# Patient Record
Sex: Male | Born: 1955 | Race: Black or African American | Hispanic: No | Marital: Married | Smoking: Never smoker
Health system: Southern US, Community
[De-identification: ages and names within clinical notes are randomized; demographics above are authoritative.]

## PROBLEM LIST (undated history)

## (undated) DIAGNOSIS — F528 Other sexual dysfunction not due to a substance or known physiological condition: Secondary | ICD-10-CM

## (undated) DIAGNOSIS — Z972 Presence of dental prosthetic device (complete) (partial): Secondary | ICD-10-CM

## (undated) DIAGNOSIS — I10 Essential (primary) hypertension: Secondary | ICD-10-CM

## (undated) DIAGNOSIS — Z0000 Encounter for general adult medical examination without abnormal findings: Secondary | ICD-10-CM

## (undated) DIAGNOSIS — K08109 Complete loss of teeth, unspecified cause, unspecified class: Secondary | ICD-10-CM

## (undated) DIAGNOSIS — S0990XA Unspecified injury of head, initial encounter: Secondary | ICD-10-CM

## (undated) DIAGNOSIS — K219 Gastro-esophageal reflux disease without esophagitis: Secondary | ICD-10-CM

## (undated) DIAGNOSIS — E7800 Pure hypercholesterolemia, unspecified: Secondary | ICD-10-CM

## (undated) DIAGNOSIS — E785 Hyperlipidemia, unspecified: Secondary | ICD-10-CM

## (undated) DIAGNOSIS — Z973 Presence of spectacles and contact lenses: Secondary | ICD-10-CM

## (undated) DIAGNOSIS — R7302 Impaired glucose tolerance (oral): Secondary | ICD-10-CM

## (undated) HISTORY — DX: Hyperlipidemia, unspecified: E78.5

## (undated) HISTORY — PX: MULTIPLE TOOTH EXTRACTIONS: SHX2053

## (undated) HISTORY — PX: CIRCUMCISION: SUR203

## (undated) HISTORY — DX: Pure hypercholesterolemia, unspecified: E78.00

## (undated) HISTORY — DX: Essential (primary) hypertension: I10

## (undated) HISTORY — DX: Impaired glucose tolerance (oral): R73.02

## (undated) HISTORY — DX: Unspecified injury of head, initial encounter: S09.90XA

## (undated) HISTORY — DX: Other sexual dysfunction not due to a substance or known physiological condition: F52.8

## (undated) HISTORY — DX: Morbid (severe) obesity due to excess calories: E66.01

## (undated) HISTORY — DX: Encounter for general adult medical examination without abnormal findings: Z00.00

---

## 2004-06-25 ENCOUNTER — Ambulatory Visit: Payer: Self-pay | Admitting: Internal Medicine

## 2006-03-30 ENCOUNTER — Ambulatory Visit: Payer: Self-pay | Admitting: Internal Medicine

## 2006-04-17 ENCOUNTER — Ambulatory Visit: Payer: Self-pay | Admitting: Internal Medicine

## 2006-05-05 ENCOUNTER — Ambulatory Visit: Payer: Self-pay | Admitting: Internal Medicine

## 2007-04-08 ENCOUNTER — Encounter: Payer: Self-pay | Admitting: Internal Medicine

## 2007-04-08 DIAGNOSIS — E7800 Pure hypercholesterolemia, unspecified: Secondary | ICD-10-CM

## 2007-04-08 DIAGNOSIS — F528 Other sexual dysfunction not due to a substance or known physiological condition: Secondary | ICD-10-CM

## 2007-04-08 DIAGNOSIS — S0990XA Unspecified injury of head, initial encounter: Secondary | ICD-10-CM

## 2007-04-08 DIAGNOSIS — I10 Essential (primary) hypertension: Secondary | ICD-10-CM

## 2007-04-08 HISTORY — DX: Essential (primary) hypertension: I10

## 2007-04-08 HISTORY — DX: Pure hypercholesterolemia, unspecified: E78.00

## 2007-04-08 HISTORY — DX: Other sexual dysfunction not due to a substance or known physiological condition: F52.8

## 2007-04-08 HISTORY — DX: Morbid (severe) obesity due to excess calories: E66.01

## 2007-04-08 HISTORY — DX: Unspecified injury of head, initial encounter: S09.90XA

## 2007-04-11 DIAGNOSIS — E785 Hyperlipidemia, unspecified: Secondary | ICD-10-CM

## 2007-04-11 HISTORY — DX: Hyperlipidemia, unspecified: E78.5

## 2007-09-21 ENCOUNTER — Emergency Department (HOSPITAL_COMMUNITY): Admission: EM | Admit: 2007-09-21 | Discharge: 2007-09-21 | Payer: Self-pay | Admitting: Emergency Medicine

## 2007-09-23 ENCOUNTER — Ambulatory Visit: Payer: Self-pay | Admitting: Internal Medicine

## 2007-09-23 LAB — CONVERTED CEMR LAB
AST: 28 units/L (ref 0–37)
Basophils Relative: 0.8 % (ref 0.0–1.0)
Bilirubin, Direct: 0.1 mg/dL (ref 0.0–0.3)
CO2: 28 meq/L (ref 19–32)
Chloride: 106 meq/L (ref 96–112)
Eosinophils Absolute: 0 10*3/uL (ref 0.0–0.6)
Eosinophils Relative: 0.6 % (ref 0.0–5.0)
GFR calc Af Amer: 82 mL/min
GFR calc non Af Amer: 68 mL/min
Glucose, Bld: 97 mg/dL (ref 70–99)
HCT: 43.3 % (ref 39.0–52.0)
Hemoglobin: 14.2 g/dL (ref 13.0–17.0)
Leukocytes, UA: NEGATIVE
Lymphocytes Relative: 29.6 % (ref 12.0–46.0)
MCV: 86.4 fL (ref 78.0–100.0)
Neutro Abs: 4 10*3/uL (ref 1.4–7.7)
Neutrophils Relative %: 57.9 % (ref 43.0–77.0)
Nitrite: NEGATIVE
PSA: 0.6 ng/mL (ref 0.10–4.00)
Sodium: 139 meq/L (ref 135–145)
Specific Gravity, Urine: 1.015 (ref 1.000–1.03)
Total Protein: 7.2 g/dL (ref 6.0–8.3)
Triglycerides: 141 mg/dL (ref 0–149)
Urobilinogen, UA: 0.2 (ref 0.0–1.0)
WBC: 6.9 10*3/uL (ref 4.5–10.5)

## 2008-03-30 ENCOUNTER — Ambulatory Visit: Payer: Self-pay | Admitting: Internal Medicine

## 2008-03-30 ENCOUNTER — Encounter (INDEPENDENT_AMBULATORY_CARE_PROVIDER_SITE_OTHER): Payer: Self-pay | Admitting: *Deleted

## 2008-04-20 ENCOUNTER — Ambulatory Visit: Payer: Self-pay

## 2008-04-20 ENCOUNTER — Encounter: Payer: Self-pay | Admitting: Internal Medicine

## 2008-04-21 ENCOUNTER — Ambulatory Visit: Payer: Self-pay | Admitting: Internal Medicine

## 2010-02-13 ENCOUNTER — Emergency Department (HOSPITAL_COMMUNITY): Admission: EM | Admit: 2010-02-13 | Discharge: 2010-02-13 | Payer: Self-pay | Admitting: Emergency Medicine

## 2010-02-18 ENCOUNTER — Ambulatory Visit: Payer: Self-pay | Admitting: Internal Medicine

## 2010-03-26 ENCOUNTER — Ambulatory Visit: Payer: Self-pay | Admitting: Internal Medicine

## 2010-03-26 LAB — CONVERTED CEMR LAB
AST: 30 units/L (ref 0–37)
Albumin: 3.9 g/dL (ref 3.5–5.2)
Alkaline Phosphatase: 70 units/L (ref 39–117)
Basophils Absolute: 0 10*3/uL (ref 0.0–0.1)
Bilirubin Urine: NEGATIVE
Bilirubin, Direct: 0.1 mg/dL (ref 0.0–0.3)
CO2: 32 meq/L (ref 19–32)
Calcium: 9.1 mg/dL (ref 8.4–10.5)
Creatinine, Ser: 1.1 mg/dL (ref 0.4–1.5)
Eosinophils Absolute: 0.1 10*3/uL (ref 0.0–0.7)
GFR calc non Af Amer: 91.63 mL/min (ref >60)
Glucose, Bld: 84 mg/dL (ref 70–99)
HDL: 42.2 mg/dL (ref >39.00)
Hemoglobin: 13.7 g/dL (ref 13.0–17.0)
Ketones, ur: NEGATIVE mg/dL
LDL Cholesterol: 100 mg/dL — ABNORMAL HIGH (ref 0–99)
Leukocytes, UA: NEGATIVE
Lymphocytes Relative: 24.8 % (ref 12.0–46.0)
MCHC: 33.9 g/dL (ref 30.0–36.0)
Monocytes Relative: 9.7 % (ref 3.0–12.0)
Neutro Abs: 4.9 10*3/uL (ref 1.4–7.7)
Neutrophils Relative %: 64.3 % (ref 43.0–77.0)
PSA: 0.37 ng/mL (ref 0.10–4.00)
Platelets: 223 10*3/uL (ref 150.0–400.0)
RDW: 14.5 % (ref 11.5–14.6)
Sodium: 140 meq/L (ref 135–145)
Specific Gravity, Urine: 1.02 (ref 1.000–1.030)
TSH: 0.5 microintl units/mL (ref 0.35–5.50)
Total Bilirubin: 0.6 mg/dL (ref 0.3–1.2)
Total CHOL/HDL Ratio: 4
Urine Glucose: NEGATIVE mg/dL
Urobilinogen, UA: 0.2 (ref 0.0–1.0)
VLDL: 38.8 mg/dL (ref 0.0–40.0)

## 2010-09-10 NOTE — Assessment & Plan Note (Signed)
Summary: 1 mth physical--stc   Vital Signs:  Patient profile:   55 year old male Height:      74 inches Weight:      269.38 pounds BMI:     34.71 O2 Sat:      96 % on Room air Temp:     97 degrees F oral Pulse rate:   66 / minute BP sitting:   160 / 94  (left arm) Cuff size:   large  Vitals Entered By: Zella Ball Ewing CMA Duncan Dull) (March 26, 2010 10:43 AM)  O2 Flow:  Room air  Preventive Care Screening     declines colonoscopy  CC: Adult Physical/RE   CC:  Adult Physical/RE.  History of Present Illness: overall doing well,Pt denies CP, sob, doe, wheezing, orthopnea, pnd, worsening LE edema, palps, dizziness or syncope  Pt denies new neuro symptoms such as headache, facial or extremity weakness  No fever, wt loss, night sweats, loss of appetite or other constitutional symptoms  Overall good complaicne iwth meds, good tolerance.  BP at work still in the 150's systolic.   [place] wt has been as much as 307 in the past; now improved with better diet per pt  Preventive Screening-Counseling & Management      Drug Use:  no.    Problems Prior to Update: 1)  Preventive Health Care  (ICD-V70.0) 2)  Head Trauma, Closed  (ICD-959.01) 3)  Erectile Dysfunction  (ICD-302.72) 4)  Morbid Obesity  (ICD-278.01) 5)  Hyperlipidemia  (ICD-272.4) 6)  Hypertension  (ICD-401.9) 7)  Hypercholesterolemia  (ICD-272.0)  Medications Prior to Update: 1)  Lisinopril-Hydrochlorothiazide 20-12.5 Mg Tabs (Lisinopril-Hydrochlorothiazide) .... 2 By Mouth Once Daily 2)  Labetalol Hcl 200 Mg Tabs (Labetalol Hcl) .Marland Kitchen.. 1po Two Times A Day 3)  Lovastatin 40 Mg Tabs (Lovastatin) .... Take 1 Tablet By Mouth Once A Day 4)  Adult Aspirin Ec Low Strength 81 Mg Tbec (Aspirin) .Marland Kitchen.. 1 By Mouth Once Daily 5)  Cialis 20 Mg Tabs (Tadalafil) .Marland Kitchen.. 1 By Mouth Every Other Day As Needed  Current Medications (verified): 1)  Lisinopril-Hydrochlorothiazide 20-12.5 Mg Tabs (Lisinopril-Hydrochlorothiazide) .... 2 By Mouth Once  Daily 2)  Labetalol Hcl 200 Mg Tabs (Labetalol Hcl) .Marland Kitchen.. 1po Two Times A Day 3)  Lovastatin 40 Mg Tabs (Lovastatin) .... Take 1 Tablet By Mouth Once A Day 4)  Adult Aspirin Ec Low Strength 81 Mg Tbec (Aspirin) .Marland Kitchen.. 1 By Mouth Once Daily 5)  Cialis 20 Mg Tabs (Tadalafil) .Marland Kitchen.. 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         all otherwise negative per pt -    Physical Exam  General:  alert and overweight-appearing.but less so Head:  normocephalic and atraumatic.   Eyes:  vision grossly intact, pupils equal, and pupils round.   Ears:  R ear normal and L ear normal.   Nose:  no external deformity and no nasal discharge.   Mouth:  no gingival abnormalities and  pharynx pink and moist.   Neck:  supple and no masses.   Lungs:  normal respiratory effort and normal breath sounds.   Heart:  normal rate and regular rhythm.   Abdomen:  soft, non-tender, and normal bowel sounds.   Msk:  no joint tenderness and no joint swelling.   Extremities:  no edema, no erythema  Neurologic:  cranial nerves II-XII intact and strength normal in all extremities.     Impression & Recommendations:  Problem # 1:  Preventive Health Care (ICD-V70.0)  Overall doing well, age appropriate education and counseling updated and referral for appropriate preventive services done unless declined, immunizations up to date or declined, diet counseling done if overweight, urged to quit smoking if smokes , most recent labs reviewed and current ordered if appropriate, ecg reviewed or declined (interpretation per ECG scanned in the EMR if done); information regarding Medicare Prevention requirements given if appropriate; speciality referrals updated as appropriate   Orders: TLB-BMP (Basic Metabolic Panel-BMET) (80048-METABOL) TLB-CBC Platelet - w/Differential (85025-CBCD) TLB-Hepatic/Liver Function Pnl (80076-HEPATIC) TLB-Lipid Panel (80061-LIPID) TLB-PSA (Prostate Specific Antigen) (84153-PSA) TLB-TSH (Thyroid Stimulating Hormone) (84443-TSH) TLB-Udip ONLY (81003-UDIP)  Problem # 2:  HYPERTENSION (ICD-401.9)  His updated medication list for this problem includes:    Lisinopril-hydrochlorothiazide 20-12.5 Mg Tabs (Lisinopril-hydrochlorothiazide) .Marland Kitchen... 2 by mouth once daily    Labetalol Hcl 200 Mg Tabs (Labetalol hcl) .Marland Kitchen... 1po two times a day    Amlodipine Besylate 5 Mg Tabs (Amlodipine besylate) .Marland Kitchen... 1po once daily uncontrolled - to add the norvasc as above, f/u BP at work and next visit  BP today: 160/94 Prior BP: 174/100 (02/18/2010)  Labs Reviewed: K+: 4.5 (09/23/2007) Creat: : 1.2 (09/23/2007)   Chol: 226 (09/23/2007)   HDL: 40.5 (09/23/2007)   LDL: DEL (09/23/2007)   TG:  141 (09/23/2007)  Complete Medication List: 1)  Lisinopril-hydrochlorothiazide 20-12.5 Mg Tabs (Lisinopril-hydrochlorothiazide) .... 2 by mouth once daily 2)  Labetalol Hcl 200 Mg Tabs (Labetalol hcl) .Marland Kitchen.. 1po two times a day 3)  Lovastatin 40 Mg Tabs (Lovastatin) .... Take 1 tablet by mouth once a day 4)  Adult Aspirin Ec Low Strength 81 Mg Tbec (Aspirin) .Marland Kitchen.. 1 by mouth once daily 5)  Cialis 20 Mg Tabs (Tadalafil) .Marland Kitchen.. 1 by mouth every other day as needed 6)  Amlodipine Besylate 5 Mg Tabs (Amlodipine besylate) .Marland Kitchen.. 1po once daily  Other Orders: Tdap => 53yrs IM ([address]) Admin 1st Vaccine ([address])  Patient Instructions: 1)  you had the tetanus shot today 2)  Please go to the Lab in the basement for your blood and/or urine tests today 3)  you are given the refills today - all called in to the pharmacy 4)  start the amlodipine 5 mg per day 5)  please call when you are ready to get the colonoscopy done 6)  Please schedule a follow-up appointment in 6 months 7)  Check your Blood Pressure regularly. If it is above 140/90: you should make an appointment sooner Prescriptions: AMLODIPINE BESYLATE 5 MG TABS (AMLODIPINE BESYLATE) 1po once  daily  #90 x 3   Entered and Authorized by:   Corwin Levins MD   Signed by:   Corwin Levins MD on 03/26/2010   Method used:   Electronically to        [facility] (retail)       [address]       [place], [place]  [address]       Ph: [phone]       Fax: [phone]   RxID:   [id number] LOVASTATIN 40 MG TABS (LOVASTATIN) Take 1 tablet by mouth once a day  #90 x 3   Entered and Authorized by:   Corwin Levins MD   Signed by:   Corwin Levins MD on 03/26/2010   Method used:   Electronically to        [facility] (retail)       [address]       [place], [place]  [address]       Ph: [phone]       Fax: [phone]   RxID:   [id number] LABETALOL HCL 200 MG TABS (LABETALOL HCL)  1po two times a day  #180 x 3   Entered and Authorized by:   Corwin Levins MD   Signed by:   Corwin Levins MD on 03/26/2010   Method used:   Electronically to        [facility] (retail)       [address]       [place], [place]  [address]       Ph: [phone]       Fax: [phone]   RxID:   [id number] LISINOPRIL-HYDROCHLOROTHIAZIDE 20-12.5 MG TABS (LISINOPRIL-HYDROCHLOROTHIAZIDE) 2 by mouth once daily  #180 x 3   Entered and Authorized by:   Corwin Levins MD   Signed by:   Corwin Levins MD on 03/26/2010   Method used:   Electronically to        [facility] (retail)       [address]       [place], [place]  [address]       Ph: [phone]       Fax: [phone]   RxID:   [id number]    Immunizations Administered:  Tetanus Vaccine:    Vaccine Type: Tdap    Site: left deltoid    Mfr: GlaxoSmithKline    Dose: 0.5 ml    Route: IM    Given by: Zella Ball Ewing CMA (AAMA)    Exp. Date: 02/08/2012    Lot #: [id number]    VIS given: 06/29/07 version given March 26, 2010.

## 2010-09-10 NOTE — Assessment & Plan Note (Signed)
Summary: POST ER FU/ HIGH BP /NWS   Vital Signs:  Patient profile:   55 year old male Height:      74 inches Weight:      269 pounds BMI:     34.66 O2 Sat:      98 % on Room air Temp:     97.4 degrees F oral Pulse rate:   86 / minute BP sitting:   174 / 100  (left arm) Cuff size:   large  Vitals Entered By: Zella Ball Ewing CMA Duncan Dull) (February 18, 2010 10:53 AM)  O2 Flow:  Room air CC: Post ER, elevated BP/RE   CC:  Post ER and elevated BP/RE.  History of Present Illness: here to f/u;  "work got busy" and lost to f/u since 2009;  ran out of meds;  was at ERT training and found elev BP there;  went to ER july 7 - started on lis hct 20/25 - 1 per day.  Lost 6 lbs he thinks with more gym work in the past wk.  Pt denies CP, sob, doe, wheezing, orthopnea, pnd, worsening LE edema, palps, dizziness or syncope  Pt denies new neuro symptoms such as headache, facial or extremity weakness Admits to med compliance in 2009 which likely accounted for some element of hard to control BP>. Denies polydipsia, polyuria. Also c/o increased ED symtpoms over the past 6 mo with inabiltiy to maintain erections  Problems Prior to Update: 1)  Preventive Health Care  (ICD-V70.0) 2)  Head Trauma, Closed  (ICD-959.01) 3)  Erectile Dysfunction  (ICD-302.72) 4)  Morbid Obesity  (ICD-278.01) 5)  Hyperlipidemia  (ICD-272.4) 6)  Hypertension  (ICD-401.9) 7)  Hypercholesterolemia  (ICD-272.0)  Medications Prior to Update: 1)  Azor 10-40 Mg  Tabs (Amlodipine-Olmesartan) .Marland Kitchen.. 1 By Mouth Once Daily 2)  Hydrochlorothiazide 25 Mg Tabs (Hydrochlorothiazide) .... Take 1 Tablet By Mouth Once A Day 3)  Bystolic 20 Mg  Tabs (Nebivolol Hcl) .Marland Kitchen.. 1 By Mouth Once Daily 4)  Lovastatin 40 Mg Tabs (Lovastatin) .... Take 1 Tablet By Mouth Once A Day 5)  Adult Aspirin Ec Low Strength 81 Mg Tbec (Aspirin) .Marland Kitchen.. 1 By Mouth Once Daily  Current Medications (verified): 1)  Lisinopril-Hydrochlorothiazide 20-12.5 Mg Tabs  (Lisinopril-Hydrochlorothiazide) .... 2 By Mouth Once Daily 2)  Labetalol Hcl 200 Mg Tabs (Labetalol Hcl) .Marland Kitchen.. 1po Two Times A Day 3)  Lovastatin 40 Mg Tabs (Lovastatin) .... Take 1 Tablet By Mouth Once A Day 4)  Adult Aspirin Ec Low Strength 81 Mg Tbec (Aspirin) .Marland Kitchen.. 1 By Mouth Once Daily 5)  Cialis 20 Mg Tabs (Tadalafil) .Marland Kitchen.. 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   Head:  normocephalic and atraumatic.   Eyes:  vision grossly intact, pupils equal, and pupils round.   Ears:  R ear normal and L ear normal.   Nose:  no external deformity and  no nasal discharge.   Mouth:  no gingival abnormalities and pharynx pink and moist.   Neck:  supple and no masses.   Lungs:  normal respiratory effort and normal breath sounds.   Heart:  normal rate and regular rhythm.   Abdomen:  soft, non-tender, and normal bowel sounds.   Genitalia:  Testes bilaterally descended without nodularity, tenderness or masses. No scrotal masses or lesions. No penis lesions or urethral discharge. Extremities:  no edema, no erythema  Neurologic:  cranial nerves II-XII intact and strength normal in all extremities.     Impression & Recommendations:  Problem # 1:  HYPERTENSION (ICD-401.9)  The following medications were removed from the  medication list:    Hydrochlorothiazide 25 Mg Tabs (Hydrochlorothiazide) .Marland Kitchen... Take 1 tablet by mouth once a day His updated medication list for this problem includes:    Lisinopril-hydrochlorothiazide 20-12.5 Mg Tabs (Lisinopril-hydrochlorothiazide) .Marland Kitchen... 2 by mouth once daily    Labetalol Hcl 200 Mg Tabs (Labetalol hcl) .Marland Kitchen... 1po two times a day treat as above, f/u any worsening signs or symptoms   BP today: 174/100 Prior BP: 140/92 (04/21/2008)  Labs Reviewed: K+: 4.5 (09/23/2007) Creat: : 1.2 (09/23/2007)   Chol: 226 (09/23/2007)   HDL: 40.5 (09/23/2007)   LDL: DEL (09/23/2007)   TG: 141 (09/23/2007)  Problem # 2:  HYPERLIPIDEMIA (ICD-272.4)  His updated medication list for this problem includes:    Lovastatin 40 Mg Tabs (Lovastatin) .Marland Kitchen... Take 1 tablet by mouth once a day  Labs Reviewed: SGOT: 28 (09/23/2007)   SGPT: 37 (09/23/2007)   HDL:40.5 (09/23/2007)  LDL:DEL (09/23/2007)  Chol:226 (09/23/2007)  Trig:141 (09/23/2007) to re-start meds, f/u next visit  Problem # 3:  ERECTILE DYSFUNCTION (ICD-302.72)  His updated medication list for this problem includes:    Cialis 20 Mg Tabs (Tadalafil) .Marland Kitchen... 1 by mouth every other day as needed  Discussed proper use of medications, as well as side effects.   Complete Medication List: 1)  Lisinopril-hydrochlorothiazide 20-12.5 Mg Tabs (Lisinopril-hydrochlorothiazide) .... 2 by mouth once daily 2)  Labetalol Hcl 200 Mg Tabs (Labetalol hcl) .Marland Kitchen.. 1po two times a day 3)  Lovastatin 40 Mg Tabs (Lovastatin) .... Take 1 tablet by mouth once a day 4)  Adult Aspirin Ec Low Strength 81 Mg Tbec (Aspirin) .Marland Kitchen.. 1 by mouth once daily 5)  Cialis 20 Mg Tabs (Tadalafil) .Marland Kitchen.. 1 by mouth every other day as needed  Patient Instructions: 1)  Please take all new medications as prescribed 2)  Continue all previous medications as before this visit  3)  Please schedule a follow-up appointment in 1 month with CPX labs Prescriptions: CIALIS 20 MG TABS  (TADALAFIL) 1 by mouth every other day as needed  #5 x 11   Entered and Authorized by:   Corwin Levins MD   Signed by:   Corwin Levins MD on 02/18/2010   Method used:   Print then Give to Patient   RxID:   [id number] LOVASTATIN 40 MG TABS (LOVASTATIN) Take 1 tablet by mouth once a day  #90 x 3   Entered and Authorized by:   Corwin Levins MD   Signed by:   Corwin Levins MD on 02/18/2010   Method used:   Electronically to        [facility] (retail)       [address]       [place], [place]  [address]  Ph: [phone]       Fax: [phone]   RxID:   [id number] LABETALOL HCL 200 MG TABS (LABETALOL HCL) 1po two times a day  #180 x 3   Entered and Authorized by:   Corwin Levins MD   Signed by:   Corwin Levins MD on 02/18/2010   Method used:   Electronically to        [facility] (retail)       [address]       [place], [place]  [address]       Ph: [phone]       Fax: [phone]   RxID:   [id number] LISINOPRIL-HYDROCHLOROTHIAZIDE 20-12.5 MG TABS (LISINOPRIL-HYDROCHLOROTHIAZIDE) 2 by mouth once daily  #180 x 3   Entered and Authorized by:   Corwin Levins MD   Signed by:   Corwin Levins MD on 02/18/2010   Method used:   Electronically to        [facility] (retail)       [address]       [place], [place]  [address]       Ph: [phone]       Fax: [phone]   RxID:   [id number]

## 2010-09-25 ENCOUNTER — Ambulatory Visit (INDEPENDENT_AMBULATORY_CARE_PROVIDER_SITE_OTHER): Payer: 59 | Admitting: Internal Medicine

## 2010-09-25 ENCOUNTER — Encounter: Payer: Self-pay | Admitting: Internal Medicine

## 2010-09-25 DIAGNOSIS — I10 Essential (primary) hypertension: Secondary | ICD-10-CM

## 2010-09-25 DIAGNOSIS — E785 Hyperlipidemia, unspecified: Secondary | ICD-10-CM

## 2010-09-25 DIAGNOSIS — F528 Other sexual dysfunction not due to a substance or known physiological condition: Secondary | ICD-10-CM

## 2010-10-02 NOTE — Assessment & Plan Note (Signed)
Summary: 6 MOS FU #/CD   Vital Signs:  Patient profile:   55 year old male Height:      74 inches Weight:      263.75 pounds BMI:     33.99 O2 Sat:      98 % on Room air Temp:     97.3 degrees F oral Pulse rate:   69 / minute BP sitting:   158 / 100  (left arm) Cuff size:   large  Vitals Entered By: Zella Ball Ewing CMA (AAMA) (September 25, 2010 9:22 AM)  O2 Flow:  Room air CC: 6 month ROV/RE   CC:  6 month ROV/RE.  History of Present Illness: here to f/u ;  Pt denies CP, worsening sob, doe, wheezing, orthopnea, pnd, worsening LE edema, palps, dizziness or syncope  Pt denies new neuro symptoms such as headache, facial or extremity weakness   Pt denies polydipsia, polyuria   Overall good compliance with meds, trying to follow low chol diet,  - lost 6 lbs in 6 months with  better diet;  trying to walk most days for excercise;    BP at home not well controlled - similar to today .  No fever, wt loss, night sweats, loss of appetite or other constitutional symptoms  Overall good compliance with meds, and good tolerability.  Denies worsening depressive symptoms, suicidal ideation, or panic, though has mild anxiety.  Also with ongoing ED symtpoms - needs cialis regularly, no lack of libido, and med works ok.    Problems Prior to Update: 1)  Preventive Health Care  (ICD-V70.0) 2)  Head Trauma, Closed  (ICD-959.01) 3)  Erectile Dysfunction  (ICD-302.72) 4)  Morbid Obesity  (ICD-278.01) 5)  Hyperlipidemia  (ICD-272.4) 6)  Hypertension  (ICD-401.9) 7)  Hypercholesterolemia  (ICD-272.0)  Medications Prior to Update: 1)  Lisinopril-Hydrochlorothiazide 20-12.5 Mg Tabs (Lisinopril-Hydrochlorothiazide) .... 2 By Mouth Once Daily 2)  Labetalol Hcl 200 Mg Tabs (Labetalol Hcl) .Marland Kitchen.. 1po Two Times A Day 3)  Lovastatin 40 Mg Tabs (Lovastatin) .... Take 1 Tablet By Mouth Once A Day 4)  Adult Aspirin Ec Low Strength 81 Mg Tbec (Aspirin) .Marland Kitchen.. 1 By Mouth Once Daily 5)  Cialis 20 Mg Tabs (Tadalafil) .Marland Kitchen.. 1  By Mouth Every Other Day As Needed 6)  Amlodipine Besylate 5 Mg Tabs (Amlodipine Besylate) .Marland Kitchen.. 1po Once Daily  Current Medications (verified): 1)  Lisinopril-Hydrochlorothiazide 20-12.5 Mg Tabs (Lisinopril-Hydrochlorothiazide) .... 2 By Mouth Once Daily 2)  Labetalol Hcl 300 Mg Tabs (Labetalol Hcl) .Marland Kitchen.. 1po Two Times A Day 3)  Lovastatin 40 Mg Tabs (Lovastatin) .... Take 1 Tablet By Mouth Once A Day 4)  Adult Aspirin Ec Low Strength 81 Mg Tbec (Aspirin) .Marland Kitchen.. 1 By Mouth Once Daily 5)  Cialis 20 Mg Tabs (Tadalafil) .Marland Kitchen.. 1 By Mouth Every Other Day As Needed 6)  Amlodipine Besylate 10 Mg Tabs (Amlodipine Besylate) .Marland Kitchen.. 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   Eyes:  vision grossly intact, pupils equal, and pupils round.   Ears:  R ear normal and L ear normal.   Nose:  no external deformity and no nasal discharge.   Mouth:  no gingival abnormalities and pharynx pink and moist.   Neck:  supple and no masses.   Lungs:  normal respiratory effort and normal breath sounds.   Heart:  normal rate and regular rhythm.   Extremities:  no edema, no erythema    Impression & Recommendations:  Problem # 1:  HYPERTENSION (ICD-401.9)  His updated medication list for this problem includes:    Lisinopril-hydrochlorothiazide 20-12.5 Mg Tabs (Lisinopril-hydrochlorothiazide) .Marland Kitchen... 2  by mouth once daily    Labetalol Hcl 300 Mg Tabs (Labetalol hcl) .Marland Kitchen... 1po two times a day    Amlodipine Besylate 10 Mg Tabs (Amlodipine besylate) .Marland Kitchen... 1po once daily  BP today: 158/100 Prior BP: 160/94 (03/26/2010)  Labs Reviewed: K+: 4.2 (03/26/2010) Creat: : 1.1 (03/26/2010)   Chol: 181 (03/26/2010)   HDL: 42.20 (03/26/2010)   LDL: 100 (03/26/2010)   TG: 194.0 (03/26/2010) uncontrolled - to increase meds as above, f/u with BP at home and next visit  Problem # 2:  HYPERLIPIDEMIA (ICD-272.4)  His updated medication list for this problem includes:    Lovastatin 40 Mg Tabs (Lovastatin) .Marland Kitchen... Take 1 tablet by mouth once a day  Labs Reviewed: SGOT: 30 (03/26/2010)   SGPT: 24 (03/26/2010)   HDL:42.20 (03/26/2010), 40.5 (09/23/2007)  LDL:100 (03/26/2010), DEL (09/23/2007)  Chol:181 (03/26/2010), 226 (09/23/2007)  Trig:194.0 (03/26/2010), 141 (09/23/2007)  stable overall by hx and exam, ok to continue meds/tx as is , declines further labs at this time, Pt to continue diet efforts, good med tolerance; to check labs next visit - goal LDL less than100   Problem # 3:  ERECTILE DYSFUNCTION (ICD-302.72)  His updated medication list for this problem includes:    Cialis 20 Mg Tabs (Tadalafil) .Marland Kitchen... 1 by mouth every other day as needed d/w pt - declines testosterone check, likely organic - treat as above, f/u any worsening signs or symptoms , declines trial change to less expensive levitra as needed   Complete Medication List: 1)  Lisinopril-hydrochlorothiazide 20-12.5 Mg Tabs (Lisinopril-hydrochlorothiazide) .... 2 by mouth once daily 2)  Labetalol Hcl 300 Mg Tabs (Labetalol hcl) .Marland Kitchen.. 1po two times a day 3)  Lovastatin 40 Mg Tabs (Lovastatin) .... Take 1 tablet by mouth once a day 4)  Adult Aspirin Ec Low Strength 81 Mg Tbec (Aspirin) .Marland Kitchen.. 1 by mouth once daily 5)  Cialis 20 Mg Tabs (Tadalafil) .Marland Kitchen.. 1 by mouth every other day as needed 6)  Amlodipine Besylate 10 Mg Tabs (Amlodipine besylate)  .Marland Kitchen.. 1po once daily  Patient Instructions: 1)  increase the amlodipine to 10 mg per day 2)  increase the labetolol to 300 mg two times a day  3)  Continue all previous medications as before this visit  4)  Please schedule a follow-up appointment in 6 months for CPX with labs 5)  Check your Blood Pressure regularly. If it is above 140/90: you should make an appointment sooner Prescriptions: LABETALOL HCL 300 MG TABS (LABETALOL HCL) 1po two times a day  #60 x 11   Entered and Authorized by:   Corwin Levins MD   Signed by:   Corwin Levins MD on 09/25/2010   Method used:   Print then Give to Patient   RxID:   [id number] AMLODIPINE BESYLATE 10 MG TABS (AMLODIPINE BESYLATE)  1po once daily  #90 x 3   Entered and Authorized by:   Corwin Levins MD   Signed by:   Corwin Levins MD on 09/25/2010   Method used:   Print then Give to Patient   RxID:   [id number]    Orders Added: 1)  Est. Patient Level IV [[address]]

## 2010-10-27 LAB — BASIC METABOLIC PANEL
Calcium: 9 mg/dL (ref 8.4–10.5)
Creatinine, Ser: 1.05 mg/dL (ref 0.4–1.5)
GFR calc Af Amer: >60 mL/min (ref >60)
Sodium: 136 mEq/L (ref 135–145)

## 2010-10-27 LAB — URINALYSIS, ROUTINE W REFLEX MICROSCOPIC
Bilirubin Urine: NEGATIVE
Glucose, UA: NEGATIVE mg/dL
Leukocytes, UA: NEGATIVE
Nitrite: NEGATIVE
Specific Gravity, Urine: 1.026 (ref 1.005–1.030)
pH: 5 (ref 5.0–8.0)

## 2010-10-27 LAB — URINE MICROSCOPIC-ADD ON

## 2011-03-21 ENCOUNTER — Other Ambulatory Visit (INDEPENDENT_AMBULATORY_CARE_PROVIDER_SITE_OTHER): Payer: 59 | Admitting: Internal Medicine

## 2011-03-21 ENCOUNTER — Other Ambulatory Visit: Payer: Self-pay | Admitting: Internal Medicine

## 2011-03-21 ENCOUNTER — Other Ambulatory Visit (INDEPENDENT_AMBULATORY_CARE_PROVIDER_SITE_OTHER): Payer: 59

## 2011-03-21 DIAGNOSIS — Z0000 Encounter for general adult medical examination without abnormal findings: Secondary | ICD-10-CM

## 2011-03-21 DIAGNOSIS — Z1289 Encounter for screening for malignant neoplasm of other sites: Secondary | ICD-10-CM

## 2011-03-21 LAB — CBC WITH DIFFERENTIAL/PLATELET
Basophils Absolute: 0 10*3/uL (ref 0.0–0.1)
Eosinophils Absolute: 0 10*3/uL (ref 0.0–0.7)
Lymphocytes Relative: 35.5 % (ref 12.0–46.0)
MCHC: 32.9 g/dL (ref 30.0–36.0)
Neutrophils Relative %: 53.8 % (ref 43.0–77.0)
Platelets: 231 10*3/uL (ref 150.0–400.0)
RDW: 14 % (ref 11.5–14.6)

## 2011-03-21 LAB — BASIC METABOLIC PANEL
Calcium: 9.4 mg/dL (ref 8.4–10.5)
Chloride: 102 mEq/L (ref 96–112)
Creatinine, Ser: 1.3 mg/dL (ref 0.4–1.5)
Sodium: 140 mEq/L (ref 135–145)

## 2011-03-21 LAB — HEPATIC FUNCTION PANEL
AST: 29 U/L (ref 0–37)
Albumin: 4 g/dL (ref 3.5–5.2)
Alkaline Phosphatase: 71 U/L (ref 39–117)
Total Protein: 7.2 g/dL (ref 6.0–8.3)

## 2011-03-21 LAB — URINALYSIS, ROUTINE W REFLEX MICROSCOPIC
Bilirubin Urine: NEGATIVE
Leukocytes, UA: NEGATIVE
Nitrite: NEGATIVE
Specific Gravity, Urine: 1.02 (ref 1.000–1.030)
pH: 6 (ref 5.0–8.0)

## 2011-03-21 LAB — LIPID PANEL
Cholesterol: 190 mg/dL (ref 0–200)
Triglycerides: 110 mg/dL (ref 0.0–149.0)

## 2011-03-21 LAB — PSA: PSA: 0.6 ng/mL (ref 0.10–4.00)

## 2011-03-22 ENCOUNTER — Encounter: Payer: Self-pay | Admitting: Internal Medicine

## 2011-03-22 DIAGNOSIS — Z0000 Encounter for general adult medical examination without abnormal findings: Secondary | ICD-10-CM | POA: Insufficient documentation

## 2011-03-22 DIAGNOSIS — Z0001 Encounter for general adult medical examination with abnormal findings: Secondary | ICD-10-CM | POA: Insufficient documentation

## 2011-03-22 HISTORY — DX: Encounter for general adult medical examination without abnormal findings: Z00.00

## 2011-03-28 ENCOUNTER — Encounter: Payer: Self-pay | Admitting: Internal Medicine

## 2011-03-28 ENCOUNTER — Ambulatory Visit (INDEPENDENT_AMBULATORY_CARE_PROVIDER_SITE_OTHER): Payer: 59 | Admitting: Internal Medicine

## 2011-03-28 VITALS — BP 128/82 | HR 69 | Temp 98.2°F | Ht 74.0 in | Wt 269.4 lb

## 2011-03-28 DIAGNOSIS — I10 Essential (primary) hypertension: Secondary | ICD-10-CM

## 2011-03-28 DIAGNOSIS — E785 Hyperlipidemia, unspecified: Secondary | ICD-10-CM

## 2011-03-28 DIAGNOSIS — Z0000 Encounter for general adult medical examination without abnormal findings: Secondary | ICD-10-CM

## 2011-03-28 MED ORDER — LISINOPRIL-HYDROCHLOROTHIAZIDE 20-12.5 MG PO TABS: 2.0000 | ORAL_TABLET | Freq: Every day | ORAL | Status: DC

## 2011-03-28 MED ORDER — AMLODIPINE BESYLATE 10 MG PO TABS: 10.0000 mg | ORAL_TABLET | Freq: Every day | ORAL | Status: DC

## 2011-03-28 MED ORDER — TADALAFIL 20 MG PO TABS: ORAL_TABLET | ORAL | Status: DC

## 2011-03-28 MED ORDER — LABETALOL HCL 300 MG PO TABS: 300.0000 mg | ORAL_TABLET | Freq: Two times a day (BID) | ORAL | Status: DC

## 2011-03-28 MED ORDER — LOVASTATIN 40 MG PO TABS: 40.0000 mg | ORAL_TABLET | Freq: Every day | ORAL | Status: DC

## 2011-03-28 NOTE — Assessment & Plan Note (Signed)
Stable, ecg reviewed - sinus without acute

## 2011-03-28 NOTE — Patient Instructions (Signed)
Continue all other medications as before All of your prescriptoins were sent to the pharmacy You will be contacted regarding the referral for: colonoscopy Please return in 1 year for your yearly visit, or sooner if needed, with Lab testing done 3-5 days before

## 2011-03-28 NOTE — Progress Notes (Signed)
Subjective:    Patient ID: Peter Kent, male    DOB: Dec 20, 1955, 55 y.o.   MRN: [id number]  HPI Here for wellness and f/u;  Overall doing ok;  Pt denies CP, worsening SOB, DOE, wheezing, orthopnea, PND, worsening LE edema, palpitations, dizziness or syncope.  Pt denies neurological change such as new Headache, facial or extremity weakness.  Pt denies polydipsia, polyuria, or low sugar symptoms. Pt states overall good compliance with treatment and medications, good tolerability, and trying to follow lower cholesterol diet.  Pt denies worsening depressive symptoms, suicidal ideation or panic. No fever, wt loss, night sweats, loss of appetite, or other constitutional symptoms.  Pt states good ability with ADL's, low fall risk, home safety reviewed and adequate, no significant changes in hearing or vision, and occasionally active with exercise. No acute complaints. Past Medical History  Diagnosis Date  . ERECTILE DYSFUNCTION 04/08/2007  . HEAD TRAUMA, CLOSED 04/08/2007  . HYPERCHOLESTEROLEMIA 04/08/2007  . HYPERLIPIDEMIA 04/11/2007  . HYPERTENSION 04/08/2007  . Morbid obesity 04/08/2007  . Preventative health care 03/22/2011   No past surgical history on file.  reports that he has never smoked. He does not have any smokeless tobacco history on file. His alcohol and drug histories not on file. family history includes Cancer in his other; Diabetes in his other; Heart disease in his other; and Hypertension in his other. No Known Allergies Current Outpatient Prescriptions on File Prior to Visit  Medication Sig Dispense Refill  . aspirin 81 MG tablet Take 81 mg by mouth daily.         Review of Systems Review of Systems  Constitutional: Negative for diaphoresis, activity change, appetite change and unexpected weight change.  HENT: Negative for hearing loss, ear pain, facial swelling, mouth sores and neck stiffness.   Eyes: Negative for pain, redness and visual disturbance.  Respiratory: Negative for  shortness of breath and wheezing.   Cardiovascular: Negative for chest pain and palpitations.  Gastrointestinal: Negative for diarrhea, blood in stool, abdominal distention and rectal pain.  Genitourinary: Negative for hematuria, flank pain and decreased urine volume.  Musculoskeletal: Negative for myalgias and joint swelling.  Skin: Negative for color change and wound.  Neurological: Negative for syncope and numbness.  Hematological: Negative for adenopathy.  Psychiatric/Behavioral: Negative for hallucinations, self-injury, decreased concentration and agitation.      Objective:   Physical Exam BP 128/82  Pulse 69  Temp(Src) 98.2 F (36.8 C) (Oral)  Ht 6\' 2"  (1.88 m)  Wt 269 lb 6.4 oz (122.199 kg)  BMI 34.59 kg/m2  SpO2 97% Physical Exam  VS noted Constitutional: Pt is oriented to person, place, and time. Appears well-developed and well-nourished.  HENT:  Head: Normocephalic and atraumatic.  Right Ear: External ear normal.  Left Ear: External ear normal.  Nose: Nose normal.  Mouth/Throat: Oropharynx is clear and moist.  Eyes: Conjunctivae and EOM are normal. Pupils are equal, round, and reactive to light.  Neck: Normal range of motion. Neck supple. No JVD present. No tracheal deviation present.  Cardiovascular: Normal rate, regular rhythm, normal heart sounds and intact distal pulses.   Pulmonary/Chest: Effort normal and breath sounds normal.  Abdominal: Soft. Bowel sounds are normal. There is no tenderness.  Musculoskeletal: Normal range of motion. Exhibits no edema.  Lymphadenopathy:  Has no cervical adenopathy.  Neurological: Pt is alert and oriented to person, place, and time. Pt has normal reflexes. No cranial nerve deficit.  Skin: Skin is warm and dry. No rash noted.  Psychiatric:  Has  normal mood and affect. Behavior is normal.     Assessment & Plan:

## 2011-03-28 NOTE — Assessment & Plan Note (Signed)
Mild increased over last yr due to diet indiscretion - ok to cont meds as is, work on lower chol diet  Lab Results  Component Value Date   LDLCALC 120* 03/21/2011

## 2011-03-28 NOTE — Assessment & Plan Note (Signed)
Overall doing well, age appropriate education and counseling updated, referrals for preventative services and immunizations addressed, dietary and smoking counseling addressed, most recent labs and ECG reviewed.  I have personally reviewed and have noted: 1) the patient's medical and social history 2) The pt's use of alcohol, tobacco, and illicit drugs 3) The patient's current medications and supplements 4) Functional ability including ADL's, fall risk, home safety risk, hearing and visual impairment 5) Diet and physical activities 6) Evidence for depression or mood disorder 7) The patient's height, weight, and BMI have been recorded in the chart I have made referrals, and provided counseling and education based on review of the above For colonsocpy as he is due

## 2011-06-05 ENCOUNTER — Encounter: Payer: Self-pay | Admitting: Gastroenterology

## 2011-11-28 ENCOUNTER — Ambulatory Visit (INDEPENDENT_AMBULATORY_CARE_PROVIDER_SITE_OTHER): Payer: 59 | Admitting: Internal Medicine

## 2011-11-28 ENCOUNTER — Encounter: Payer: Self-pay | Admitting: Internal Medicine

## 2011-11-28 ENCOUNTER — Encounter: Payer: Self-pay | Admitting: *Deleted

## 2011-11-28 VITALS — BP 162/100 | HR 72 | Temp 97.8°F | Ht 72.0 in | Wt 259.8 lb

## 2011-11-28 DIAGNOSIS — I10 Essential (primary) hypertension: Secondary | ICD-10-CM

## 2011-11-28 MED ORDER — LABETALOL HCL 200 MG PO TABS: ORAL_TABLET | ORAL | Status: DC

## 2011-11-28 NOTE — Patient Instructions (Signed)
Please increase the labetolol as prescribed (to total 400 mg twice per day) Continue all other medications as before Please check your blood pressure on a regular basis; your goal is to be less than 140/90 You are given the work note Please have the pharmacy call with any refills you may need.

## 2011-11-29 ENCOUNTER — Encounter: Payer: Self-pay | Admitting: Internal Medicine

## 2011-11-29 NOTE — Progress Notes (Signed)
  Subjective:    Patient ID: Peter Kent, male    DOB: [birth year], 56 y.o.   MRN: [id number]  HPI  Here after found to have elev BP by nurse at work 200/120, has not check BP himself recently at home or elsewhere, last seen here aug 2012, has overall doing well and asymptomatic- Pt denies chest pain, increased sob or doe, wheezing, orthopnea, PND, increased LE swelling, palpitations, dizziness or syncope.  Pt denies new neurological symptoms such as new headache, or facial or extremity weakness or numbness   Pt denies polydipsia, polyuria, Pt states overall good compliance with meds, trying to follow lower cholesterol diet, and wt down 10 lbs since last visit.   Pt denies fever, wt loss, night sweats, loss of appetite, or other constitutional symptoms  Denies worsening depressive symptoms, suicidal ideation, or panic.  Does mention eating out at buffet last night with large salt intake but overall tries to avoid.  No hx of known endorgan damage. Past Medical History  Diagnosis Date  . ERECTILE DYSFUNCTION 04/08/2007  . HEAD TRAUMA, CLOSED 04/08/2007  . HYPERCHOLESTEROLEMIA 04/08/2007  . HYPERLIPIDEMIA 04/11/2007  . HYPERTENSION 04/08/2007  . Morbid obesity 04/08/2007  . Preventative health care 03/22/2011   History reviewed. No pertinent past surgical history.  reports that he has never smoked. He has never used smokeless tobacco. He reports that he does not drink alcohol or use illicit drugs. family history includes Cancer in his other; Diabetes in his other; Heart disease in his other; and Hypertension in his other. No Known Allergies Current Outpatient Prescriptions on File Prior to Visit  Medication Sig Dispense Refill  . amLODipine (NORVASC) 10 MG tablet Take 1 tablet (10 mg total) by mouth daily.  90 tablet  3  . aspirin 81 MG tablet Take 81 mg by mouth daily.        Marland Kitchen lisinopril-hydrochlorothiazide (PRINZIDE,ZESTORETIC) 20-12.5 MG per tablet Take 2 tablets by mouth daily.  90 tablet  3  .  lovastatin (MEVACOR) 40 MG tablet Take 1 tablet (40 mg total) by mouth daily.  90 tablet  3   Review of Systems Review of Systems  Constitutional: Negative for diaphoresis and unexpected weight change.  Eyes: Negative for photophobia and visual disturbance.  Respiratory: Negative for choking and stridor.   Gastrointestinal: Negative for vomiting and blood in stool.  Genitourinary: Negative for hematuria and decreased urine volume.  Musculoskeletal: Negative for gait problem.  Skin: Negative for color change and wound.  Neurological: Negative for tremors and numbness.     Objective:   Physical Exam BP 162/100  Pulse 72  Temp(Src) 97.8 F (36.6 C) (Oral)  Ht 6' (1.829 m)  Wt 259 lb 12.8 oz (117.845 kg)  BMI 35.24 kg/m2  SpO2 98% Physical Exam  VS noted Constitutional: Pt appears well-developed and well-nourished.  HENT: Head: Normocephalic.  Right Ear: External ear normal.  Left Ear: External ear normal.  Eyes: Conjunctivae and EOM are normal. Pupils are equal, round, and reactive to light.  Neck: Normal range of motion. Neck supple.  Cardiovascular: Normal rate and regular rhythm.   Pulmonary/Chest: Effort normal and breath sounds normal.  Abd:  Soft, NT, non-distended, + BS Neurological: Pt is alert. No cranial nerve deficit.  Skin: Skin is warm. No erythema.  Psychiatric: Pt behavior is normal. Thought content normal.     Assessment & Plan:

## 2011-11-29 NOTE — Assessment & Plan Note (Signed)
Mild uncontrolled, to incr the labetolol to 400 bid, Continue all other medications as before, low salt diet  BP Readings from Last 3 Encounters:  11/28/11 162/100  03/28/11 128/82  09/25/10 158/100

## 2011-12-01 DIAGNOSIS — Z0279 Encounter for issue of other medical certificate: Secondary | ICD-10-CM

## 2012-03-30 ENCOUNTER — Encounter: Payer: 59 | Admitting: Internal Medicine

## 2012-03-30 ENCOUNTER — Other Ambulatory Visit: Payer: Self-pay | Admitting: Internal Medicine

## 2012-05-26 ENCOUNTER — Encounter: Payer: 59 | Admitting: Internal Medicine

## 2012-07-23 ENCOUNTER — Encounter: Payer: 59 | Admitting: Internal Medicine

## 2012-07-23 DIAGNOSIS — Z0289 Encounter for other administrative examinations: Secondary | ICD-10-CM

## 2012-09-27 ENCOUNTER — Telehealth: Payer: Self-pay

## 2012-09-27 MED ORDER — LOVASTATIN 40 MG PO TABS: 40.0000 mg | ORAL_TABLET | Freq: Every day | ORAL | Status: DC

## 2012-09-27 NOTE — Telephone Encounter (Signed)
rx filled

## 2013-03-09 ENCOUNTER — Other Ambulatory Visit: Payer: Self-pay | Admitting: Internal Medicine

## 2013-04-06 ENCOUNTER — Other Ambulatory Visit: Payer: Self-pay

## 2013-04-06 MED ORDER — LISINOPRIL-HYDROCHLOROTHIAZIDE 20-12.5 MG PO TABS: ORAL_TABLET | ORAL | Status: DC

## 2013-04-23 ENCOUNTER — Other Ambulatory Visit: Payer: Self-pay | Admitting: Internal Medicine

## 2013-04-25 NOTE — Telephone Encounter (Signed)
Pt has not been seen since 4.19.13. OK to refill?

## 2013-04-26 NOTE — Telephone Encounter (Signed)
Called the patient to inform, but numbers in chart were not in service

## 2013-04-26 NOTE — Telephone Encounter (Signed)
Done erx - 1 mo only  Due for ROV - robin to let pt know

## 2013-05-17 ENCOUNTER — Encounter: Payer: Self-pay | Admitting: Internal Medicine

## 2013-05-17 ENCOUNTER — Ambulatory Visit (INDEPENDENT_AMBULATORY_CARE_PROVIDER_SITE_OTHER): Payer: 59 | Admitting: Internal Medicine

## 2013-05-17 VITALS — BP 136/100 | HR 77 | Temp 97.9°F | Ht 72.0 in | Wt 252.0 lb

## 2013-05-17 DIAGNOSIS — K3189 Other diseases of stomach and duodenum: Secondary | ICD-10-CM

## 2013-05-17 DIAGNOSIS — Z0000 Encounter for general adult medical examination without abnormal findings: Secondary | ICD-10-CM

## 2013-05-17 DIAGNOSIS — R1013 Epigastric pain: Secondary | ICD-10-CM

## 2013-05-17 MED ORDER — LOVASTATIN 40 MG PO TABS: 40.0000 mg | ORAL_TABLET | Freq: Every day | ORAL | Status: DC

## 2013-05-17 MED ORDER — OMEPRAZOLE 20 MG PO CPDR: 20.0000 mg | DELAYED_RELEASE_CAPSULE | Freq: Every day | ORAL | Status: DC

## 2013-05-17 NOTE — Patient Instructions (Signed)
Please take all new medication as prescribed Please continue all other medications as before, and refills have been done if requested. Please have the pharmacy call with any other refills you may need. Please continue your efforts at being more active, low cholesterol diet, and weight control. You are otherwise up to date with prevention measures today. You will be contacted regarding the referral for: colonoscopy Please go to the LAB in the Basement (turn left off the elevator) for the tests to be done tomorrow You will be contacted by phone if any changes need to be made immediately.  Otherwise, you will receive a letter about your results with an explanation, but please check with MyChart first.  Please remember to sign up for My Chart if you have not done so, as this will be important to you in the future with finding out test results, communicating by private email, and scheduling acute appointments online when needed.  Please return in 1 year for your yearly visit, or sooner if needed

## 2013-05-17 NOTE — Assessment & Plan Note (Signed)

## 2013-05-17 NOTE — Progress Notes (Signed)
Subjective:    Patient ID: Peter Kent, male    DOB: 03/21/1956, 57 y.o.   MRN: [id number]  HPI  Here for wellness and f/u;  Overall doing ok;  Pt denies CP, worsening SOB, DOE, wheezing, orthopnea, PND, worsening LE edema, palpitations, dizziness or syncope.  Pt denies neurological change such as new headache, facial or extremity weakness.  Pt denies polydipsia, polyuria, or low sugar symptoms. Pt states overall good compliance with treatment and medications, good tolerability, and has been trying to follow lower cholesterol diet.  Pt denies worsening depressive symptoms, suicidal ideation or panic. No fever, night sweats, wt loss, loss of appetite, or other constitutional symptoms.  Pt states good ability with ADL's, has low fall risk, home safety reviewed and adequate, no other significant changes in hearing or vision, and only occasionally active with exercise.  Also with episode several in last few wks of odd discomfort and nausea, none today, better with TUMS. Denies dysphagia, n/v, bowel change or blood.  Past Medical History  Diagnosis Date  . ERECTILE DYSFUNCTION 04/08/2007  . HEAD TRAUMA, CLOSED 04/08/2007  . HYPERCHOLESTEROLEMIA 04/08/2007  . HYPERLIPIDEMIA 04/11/2007  . HYPERTENSION 04/08/2007  . Morbid obesity 04/08/2007  . Preventative health care 03/22/2011   No past surgical history on file.  reports that he has never smoked. He has never used smokeless tobacco. He reports that he does not drink alcohol or use illicit drugs. family history includes Cancer in his other; Diabetes in his other; Heart disease in his other; Hypertension in his other. No Known Allergies Current Outpatient Prescriptions on File Prior to Visit  Medication Sig Dispense Refill  . amLODipine (NORVASC) 10 MG tablet TAKE ONE TABLET BY MOUTH EVERY DAY  30 tablet  0  . aspirin 81 MG tablet Take 81 mg by mouth daily.        Marland Kitchen labetalol (NORMODYNE) 200 MG tablet 2 tabs by mouth twice per day (for total 400 mg  twice per day)  360 tablet  3  . lisinopril-hydrochlorothiazide (PRINZIDE,ZESTORETIC) 20-12.5 MG per tablet TAKE TWO TABLETS BY MOUTH EVERY DAY  60 tablet  5   No current facility-administered medications on file prior to visit.   Review of Systems Constitutional: Negative for diaphoresis, activity change, appetite change or unexpected weight change.  HENT: Negative for hearing loss, ear pain, facial swelling, mouth sores and neck stiffness.   Eyes: Negative for pain, redness and visual disturbance.  Respiratory: Negative for shortness of breath and wheezing.   Cardiovascular: Negative for chest pain and palpitations.  Gastrointestinal: Negative for diarrhea, blood in stool, abdominal distention or other pain Genitourinary: Negative for hematuria, flank pain or change in urine volume.  Musculoskeletal: Negative for myalgias and joint swelling.  Skin: Negative for color change and wound.  Neurological: Negative for syncope and numbness. other than noted Hematological: Negative for adenopathy.  Psychiatric/Behavioral: Negative for hallucinations, self-injury, decreased concentration and agitation.      Objective:   Physical Exam BP 136/100  Pulse 77  Temp(Src) 97.9 F (36.6 C) (Oral)  Ht 6' (1.829 m)  Wt 252 lb (114.306 kg)  BMI 34.17 kg/m2  SpO2 97% VS noted,  Constitutional: Pt is oriented to person, place, and time. Appears well-developed and well-nourished.  Head: Normocephalic and atraumatic.  Right Ear: External ear normal.  Left Ear: External ear normal.  Nose: Nose normal.  Mouth/Throat: Oropharynx is clear and moist.  Eyes: Conjunctivae and EOM are normal. Pupils are equal, round, and reactive to light.  Neck: Normal range of motion. Neck supple. No JVD present. No tracheal deviation present.  Cardiovascular: Normal rate, regular rhythm, normal heart sounds and intact distal pulses.   Pulmonary/Chest: Effort normal and breath sounds normal.  Abdominal: Soft. Bowel  sounds are normal. There is no tenderness. No HSM  Musculoskeletal: Normal range of motion. Exhibits no edema.  Lymphadenopathy:  Has no cervical adenopathy.  Neurological: Pt is alert and oriented to person, place, and time. Pt has normal reflexes. No cranial nerve deficit.  Skin: Skin is warm and dry. No rash noted.  Psychiatric:  Has  normal mood and affect. Behavior is normal.         Assessment & Plan:

## 2013-05-17 NOTE — Assessment & Plan Note (Signed)
prob reflux, for prilosec prn, anti-reflux precautions, to f/u any worsening symptoms or concerns

## 2013-05-18 ENCOUNTER — Encounter: Payer: Self-pay | Admitting: Internal Medicine

## 2013-05-18 ENCOUNTER — Other Ambulatory Visit (INDEPENDENT_AMBULATORY_CARE_PROVIDER_SITE_OTHER): Payer: 59

## 2013-05-18 ENCOUNTER — Telehealth: Payer: Self-pay

## 2013-05-18 DIAGNOSIS — Z0000 Encounter for general adult medical examination without abnormal findings: Secondary | ICD-10-CM

## 2013-05-18 LAB — CBC WITH DIFFERENTIAL/PLATELET
Basophils Absolute: 0 10*3/uL (ref 0.0–0.1)
Basophils Relative: 0.4 % (ref 0.0–3.0)
Eosinophils Relative: 0.4 % (ref 0.0–5.0)
Lymphocytes Relative: 27.2 % (ref 12.0–46.0)
MCHC: 33.9 g/dL (ref 30.0–36.0)
Monocytes Relative: 8 % (ref 3.0–12.0)
Neutrophils Relative %: 64 % (ref 43.0–77.0)
Platelets: 259 10*3/uL (ref 150.0–400.0)
RBC: 4.59 Mil/uL (ref 4.22–5.81)
WBC: 6.4 10*3/uL (ref 4.5–10.5)

## 2013-05-18 LAB — URINALYSIS, ROUTINE W REFLEX MICROSCOPIC
Bilirubin Urine: NEGATIVE
Ketones, ur: NEGATIVE
Leukocytes, UA: NEGATIVE
Nitrite: NEGATIVE
Specific Gravity, Urine: >=1.03 (ref 1.000–1.030)
Total Protein, Urine: NEGATIVE
Urobilinogen, UA: 0.2 (ref 0.0–1.0)
pH: 6 (ref 5.0–8.0)

## 2013-05-18 LAB — BASIC METABOLIC PANEL
BUN: 17 mg/dL (ref 6–23)
Chloride: 105 mEq/L (ref 96–112)
Creatinine, Ser: 1.3 mg/dL (ref 0.4–1.5)
Glucose, Bld: 150 mg/dL — ABNORMAL HIGH (ref 70–99)
Potassium: 3.5 mEq/L (ref 3.5–5.1)

## 2013-05-18 LAB — LIPID PANEL
Cholesterol: 200 mg/dL (ref 0–200)
LDL Cholesterol: 133 mg/dL — ABNORMAL HIGH (ref 0–99)
Triglycerides: 89 mg/dL (ref 0.0–149.0)
VLDL: 17.8 mg/dL (ref 0.0–40.0)

## 2013-05-18 LAB — HEPATIC FUNCTION PANEL
ALT: 19 U/L (ref 0–53)
AST: 28 U/L (ref 0–37)
Albumin: 4 g/dL (ref 3.5–5.2)
Alkaline Phosphatase: 62 U/L (ref 39–117)
Total Bilirubin: 0.8 mg/dL (ref 0.3–1.2)

## 2013-05-18 LAB — TSH: TSH: 0.68 u[IU]/mL (ref 0.35–5.50)

## 2013-05-18 MED ORDER — LOVASTATIN 20 MG PO TABS: 20.0000 mg | ORAL_TABLET | Freq: Two times a day (BID) | ORAL | Status: DC

## 2013-05-18 NOTE — Telephone Encounter (Signed)
[facility] would like to know if you can do a script for Lovastatin 20mg  BID #180 as is much cheaper thatn lovastatin 40 mg qd.  Advise if ok please

## 2013-05-18 NOTE — Telephone Encounter (Signed)
Done erx 

## 2013-05-19 ENCOUNTER — Ambulatory Visit: Payer: 59

## 2013-05-19 DIAGNOSIS — R7309 Other abnormal glucose: Secondary | ICD-10-CM

## 2013-05-24 ENCOUNTER — Other Ambulatory Visit: Payer: Self-pay | Admitting: Internal Medicine

## 2013-06-13 ENCOUNTER — Other Ambulatory Visit: Payer: Self-pay | Admitting: Internal Medicine

## 2013-07-04 ENCOUNTER — Emergency Department (HOSPITAL_COMMUNITY)
Admission: EM | Admit: 2013-07-04 | Discharge: 2013-07-04 | Disposition: A | Discharge status: Home / Self Care | Payer: 59 | Attending: Emergency Medicine | Admitting: Emergency Medicine

## 2013-07-04 ENCOUNTER — Encounter (HOSPITAL_COMMUNITY): Payer: Self-pay | Admitting: Emergency Medicine

## 2013-07-04 DIAGNOSIS — E785 Hyperlipidemia, unspecified: Secondary | ICD-10-CM | POA: Insufficient documentation

## 2013-07-04 DIAGNOSIS — Z87448 Personal history of other diseases of urinary system: Secondary | ICD-10-CM | POA: Insufficient documentation

## 2013-07-04 DIAGNOSIS — E7800 Pure hypercholesterolemia, unspecified: Secondary | ICD-10-CM | POA: Insufficient documentation

## 2013-07-04 DIAGNOSIS — Z79899 Other long term (current) drug therapy: Secondary | ICD-10-CM | POA: Insufficient documentation

## 2013-07-04 DIAGNOSIS — R112 Nausea with vomiting, unspecified: Secondary | ICD-10-CM

## 2013-07-04 DIAGNOSIS — Z7982 Long term (current) use of aspirin: Secondary | ICD-10-CM | POA: Insufficient documentation

## 2013-07-04 DIAGNOSIS — I10 Essential (primary) hypertension: Secondary | ICD-10-CM | POA: Insufficient documentation

## 2013-07-04 DIAGNOSIS — Z87828 Personal history of other (healed) physical injury and trauma: Secondary | ICD-10-CM | POA: Insufficient documentation

## 2013-07-04 LAB — COMPREHENSIVE METABOLIC PANEL
AST: 23 U/L (ref 0–37)
Albumin: 4 g/dL (ref 3.5–5.2)
Alkaline Phosphatase: 82 U/L (ref 39–117)
Calcium: 9.5 mg/dL (ref 8.4–10.5)
Creatinine, Ser: 1.03 mg/dL (ref 0.50–1.35)
GFR calc Af Amer: >90 mL/min (ref >90)

## 2013-07-04 LAB — CBC WITH DIFFERENTIAL/PLATELET
Basophils Absolute: 0 10*3/uL (ref 0.0–0.1)
Basophils Relative: 0 % (ref 0–1)
Eosinophils Relative: 0 % (ref 0–5)
HCT: 42.5 % (ref 39.0–52.0)
Hemoglobin: 14.6 g/dL (ref 13.0–17.0)
MCH: 29.3 pg (ref 26.0–34.0)
MCHC: 34.4 g/dL (ref 30.0–36.0)
MCV: 85.3 fL (ref 78.0–100.0)
Monocytes Absolute: 0.6 10*3/uL (ref 0.1–1.0)
Neutro Abs: 7.1 10*3/uL (ref 1.7–7.7)
Platelets: 246 10*3/uL (ref 150–400)
RDW: 13.6 % (ref 11.5–15.5)

## 2013-07-04 MED ORDER — ONDANSETRON HCL 4 MG PO TABS: 4.0000 mg | ORAL_TABLET | Freq: Four times a day (QID) | ORAL | Status: DC

## 2013-07-04 MED ORDER — ONDANSETRON 8 MG PO TBDP
8.0000 mg | ORAL_TABLET | Freq: Once | ORAL | Status: AC
  Administered 2013-07-04: 8 mg via ORAL
  Filled 2013-07-04: qty 1

## 2013-07-04 NOTE — ED Notes (Signed)
Pt reports ab pain that comes and goes and nausea and vomiting after eating chicken soup today. Vomited x3 with cold sweats. BP elevated at 184/106. Pt has abdominal hernia. Alert and oriented x3.

## 2013-07-04 NOTE — ED Provider Notes (Signed)
Medical screening examination/treatment/procedure(s) were performed by non-physician practitioner and as supervising physician I was immediately available for consultation/collaboration.  EKG Interpretation    Date/Time:  Monday July 04 2013 01:38:07 EST Ventricular Rate:  87 PR Interval:  164 QRS Duration: 88 QT Interval:  348 QTC Calculation: 419 R Axis:   11 Text Interpretation:  Sinus rhythm Borderline T abnormalities, inferior leads, anterolateral leads Compared to previous tracing T wave abnormality NOW PRESENT Confirmed by Fonnie Jarvis  MD, Jonny Ruiz [phone]) on 07/04/2013 1:58:05 AM              Celene Kras, MD 07/04/13 0403

## 2013-07-04 NOTE — ED Provider Notes (Signed)
CSN: [id number]     Arrival date & time 07/04/13  0108 History   First MD Initiated Contact with Patient 07/04/13 0225     Chief Complaint  Patient presents with  . Nausea  . Emesis   (Consider location/radiation/quality/duration/timing/severity/associated sxs/prior Treatment) HPI Comments: Patient is a 57 year old male who presents for nausea and vomiting x3 with onset after eating a can of [place] chicken noodle soup. Patient states his last episode of emesis was at 12:45 AM. Emesis was nonbloody and nonbilious. Symptoms have improved since onset spontaneously. He endorses associated diaphoresis in sporadic lightheadedness during episodes of emesis, both of which have resolved. He denies associated fever, chest pain or shortness of breath, abdominal pain, diarrhea, melena or hematochezia, urinary symptoms, and numbness or tingling. Patient states he feels back to baseline at this time.  Patient is a 57 y.o. male presenting with vomiting. The history is provided by the patient. No language interpreter was used.  Emesis   Past Medical History  Diagnosis Date  . ERECTILE DYSFUNCTION 04/08/2007  . HEAD TRAUMA, CLOSED 04/08/2007  . HYPERCHOLESTEROLEMIA 04/08/2007  . HYPERLIPIDEMIA 04/11/2007  . HYPERTENSION 04/08/2007  . Morbid obesity 04/08/2007  . Preventative health care 03/22/2011   History reviewed. No pertinent past surgical history. Family History  Problem Relation Age of Onset  . Hypertension Other   . Heart disease Other   . Diabetes Other   . Cancer Other     breast cancer   History  Substance Use Topics  . Smoking status: Never Smoker   . Smokeless tobacco: Never Used  . Alcohol Use: No    Review of Systems  Constitutional: Positive for diaphoresis.  Gastrointestinal: Positive for nausea and vomiting.  Neurological: Positive for light-headedness.  All other systems reviewed and are negative.    Allergies  Review of patient's allergies indicates no known  allergies.  Home Medications   Current Outpatient Rx  Name  Route  Sig  Dispense  Refill  . amLODipine (NORVASC) 10 MG tablet      TAKE ONE TABLET BY MOUTH ONCE DAILY   30 tablet   0   . aspirin 81 MG tablet   Oral   Take 81 mg by mouth daily.           Marland Kitchen aspirin-acetaminophen-caffeine (EXCEDRIN EXTRA STRENGTH) 250-250-65 MG per tablet   Oral   Take 1 tablet by mouth every 6 (six) hours as needed for headache (pain).         Marland Kitchen labetalol (NORMODYNE) 200 MG tablet      TAKE TWO TABLETS BY MOUTH TWICE DAILY   360 tablet   3   . lisinopril-hydrochlorothiazide (PRINZIDE,ZESTORETIC) 20-12.5 MG per tablet      TAKE TWO TABLETS BY MOUTH EVERY DAY   60 tablet   5   . lovastatin (MEVACOR) 20 MG tablet   Oral   Take 1 tablet (20 mg total) by mouth 2 (two) times daily.   180 tablet   3   . omeprazole (PRILOSEC) 20 MG capsule   Oral   Take 1 capsule (20 mg total) by mouth daily. As needed   90 capsule   3   . ondansetron (ZOFRAN) 4 MG tablet   Oral   Take 1 tablet (4 mg total) by mouth every 6 (six) hours.   6 tablet   0    BP 165/101  Pulse 81  Temp(Src) 97.8 F (36.6 C) (Oral)  Resp 20  Ht 6\' 2"  (1.88 m)  Wt 247 lb 2 oz (112.095 kg)  BMI 31.72 kg/m2  SpO2 99%  Physical Exam  Nursing note and vitals reviewed. Constitutional: He is oriented to person, place, and time. He appears well-developed and well-nourished. No distress.  HENT:  Head: Normocephalic and atraumatic.  Mouth/Throat: Oropharynx is clear and moist. No oropharyngeal exudate.  Eyes: Conjunctivae and EOM are normal. Pupils are equal, round, and reactive to light. No scleral icterus.  Neck: Normal range of motion.  Cardiovascular: Normal rate, regular rhythm, normal heart sounds and intact distal pulses.   Pulmonary/Chest: Effort normal and breath sounds normal. No respiratory distress. He has no wheezes. He has no rales.  Abdominal: Soft. There is no tenderness. There is no rebound and no  guarding.  No peritoneal signs or guarding. Umbilical hernia appreciated which is nontender to palpation.  Musculoskeletal: Normal range of motion.  Neurological: He is alert and oriented to person, place, and time.  Skin: Skin is warm and dry. No rash noted. He is not diaphoretic. No erythema. No pallor.  Psychiatric: He has a normal mood and affect. His behavior is normal.    ED Course  Procedures (including critical care time) Labs Review Labs Reviewed  CBC WITH DIFFERENTIAL - Abnormal; Notable for the following:    Neutrophils Relative % 79 (*)    All other components within normal limits  COMPREHENSIVE METABOLIC PANEL - Abnormal; Notable for the following:    Potassium 3.4 (*)    Glucose, Bld 112 (*)    GFR calc non Af Amer 79 (*)    All other components within normal limits  LIPASE, BLOOD   Imaging Review No results found.  EKG Interpretation    Date/Time:  Monday July 04 2013 01:38:07 EST Ventricular Rate:  87 PR Interval:  164 QRS Duration: 88 QT Interval:  348 QTC Calculation: 419 R Axis:   11 Text Interpretation:  Sinus rhythm Borderline T abnormalities, inferior leads, anterolateral leads Compared to previous tracing T wave abnormality NOW PRESENT Confirmed by [facility]  MD, Jonny Ruiz [phone]) on 07/04/2013 1:58:05 AM            MDM   1. Nausea and vomiting    57 year old male who presents for nausea and emesis after eating [place] chicken noodle soup. Patient well and nontoxic appearing, hemodynamically stable, and afebrile on initial presentation to the emergency department. Physical exam unremarkable and abdomen without tenderness to palpation. Labs today without significant findings; no leukocytosis, electrolyte imbalance, or abnormal liver kidney function. Patient treated in ED with Zofran. He is tolerating fluids by mouth and has not had an episode of emesis for 4 hours. Reexamination of the abdomen stable. Believe patient to be appropriate for d/c with  PCP follow up as needed with Rx for Zofran for any persistent nausea. Patient requesting referral to general surgery for f/u regarding his stable umbilical hernia which he has had for "years"; referral given. Return precautions for symptoms discussed and patient agreeable to plan with no unaddressed concerns.   Antony Madura, PA-C 07/04/13 [phone]

## 2013-07-05 ENCOUNTER — Ambulatory Visit (INDEPENDENT_AMBULATORY_CARE_PROVIDER_SITE_OTHER): Payer: 59 | Admitting: Internal Medicine

## 2013-07-05 ENCOUNTER — Encounter: Payer: Self-pay | Admitting: Internal Medicine

## 2013-07-05 VITALS — BP 162/100 | HR 75 | Temp 98.0°F | Ht 74.0 in | Wt 247.2 lb

## 2013-07-05 DIAGNOSIS — R7309 Other abnormal glucose: Secondary | ICD-10-CM

## 2013-07-05 DIAGNOSIS — I10 Essential (primary) hypertension: Secondary | ICD-10-CM

## 2013-07-05 DIAGNOSIS — R7302 Impaired glucose tolerance (oral): Secondary | ICD-10-CM

## 2013-07-05 HISTORY — DX: Impaired glucose tolerance (oral): R73.02

## 2013-07-05 MED ORDER — OLMESARTAN-AMLODIPINE-HCTZ 40-10-25 MG PO TABS: ORAL_TABLET | ORAL | Status: DC

## 2013-07-05 MED ORDER — LEVOFLOXACIN 250 MG PO TABS: 250.0000 mg | ORAL_TABLET | Freq: Every day | ORAL | Status: DC

## 2013-07-05 NOTE — Assessment & Plan Note (Signed)
Much improved, to cont wt loss efforts, low salt diet, ETOH in moderation to help BP as well

## 2013-07-05 NOTE — Assessment & Plan Note (Signed)
Ok to d/c amlod and lis-hct, try to consolidate to tribenzor 40-10-25 qd for hopefully better control, also on labetolol, may need change to clonidine,  to f/u any worsening symptoms or concerns

## 2013-07-05 NOTE — Progress Notes (Signed)
Subjective:    Patient ID: Peter Kent, male    DOB: [birth year], 58 y.o.   MRN: [id number]  HPI  Seen recently for BP in ER; Here to f/u; overall doing ok,  Pt denies chest pain, increased sob or doe, wheezing, orthopnea, PND, increased LE swelling, palpitations, dizziness or syncope.  Pt denies polydipsia, polyuria, or low sugar symptoms such as weakness or confusion improved with po intake.  Pt denies new neurological symptoms such as new headache, or facial or extremity weakness or numbness.   Pt states overall good compliance with meds, has been trying to follow lower cholesterol diet, with wt overall stable,  but little exercise however. Has lost wt from peak about 293, now to 247. BP persistently elev similar to today, Overall good compliance with treatment, and good medicine tolerability.  Has long hx of variable BP, neg eval for RAS per u/s done 2009, Wants to consider azor type med since wife has done well with this, even though would cost $29/mo Past Medical History  Diagnosis Date  . ERECTILE DYSFUNCTION 04/08/2007  . HEAD TRAUMA, CLOSED 04/08/2007  . HYPERCHOLESTEROLEMIA 04/08/2007  . HYPERLIPIDEMIA 04/11/2007  . HYPERTENSION 04/08/2007  . Morbid obesity 04/08/2007  . Preventative health care 03/22/2011   No past surgical history on file.  reports that he has never smoked. He has never used smokeless tobacco. He reports that he does not drink alcohol or use illicit drugs. family history includes Cancer in his other; Diabetes in his other; Heart disease in his other; Hypertension in his other. No Known Allergies Current Outpatient Prescriptions on File Prior to Visit  Medication Sig Dispense Refill  . aspirin 81 MG tablet Take 81 mg by mouth daily.        Marland Kitchen aspirin-acetaminophen-caffeine (EXCEDRIN EXTRA STRENGTH) 250-250-65 MG per tablet Take 1 tablet by mouth every 6 (six) hours as needed for headache (pain).      Marland Kitchen labetalol (NORMODYNE) 200 MG tablet TAKE TWO TABLETS BY MOUTH TWICE  DAILY  360 tablet  3  . lovastatin (MEVACOR) 20 MG tablet Take 1 tablet (20 mg total) by mouth 2 (two) times daily.  180 tablet  3  . omeprazole (PRILOSEC) 20 MG capsule Take 1 capsule (20 mg total) by mouth daily. As needed  90 capsule  3  . ondansetron (ZOFRAN) 4 MG tablet Take 1 tablet (4 mg total) by mouth every 6 (six) hours.  6 tablet  0   No current facility-administered medications on file prior to visit.   Review of Systems  Constitutional: Negative for unexpected weight change, or unusual diaphoresis  HENT: Negative for tinnitus.   Eyes: Negative for photophobia and visual disturbance.  Respiratory: Negative for choking and stridor.   Gastrointestinal: Negative for vomiting and blood in stool.  Genitourinary: Negative for hematuria and decreased urine volume.  Musculoskeletal: Negative for acute joint swelling Skin: Negative for color change and wound.  Neurological: Negative for tremors and numbness other than noted  Psychiatric/Behavioral: Negative for decreased concentration or  hyperactivity.       Objective:   Physical Exam BP 162/100  Pulse 75  Temp(Src) 98 F (36.7 C) (Oral)  Ht 6\' 2"  (1.88 m)  Wt 247 lb 4 oz (112.152 kg)  BMI 31.73 kg/m2  SpO2 96% VS noted,  Constitutional: Pt appears well-developed and well-nourished. Peter Kent HENT: Head: NCAT.  Right Ear: External ear normal.  Left Ear: External ear normal.  Eyes: Conjunctivae and EOM are normal. Pupils are equal, round, and  reactive to light.  Neck: Normal range of motion. Neck supple.  Cardiovascular: Normal rate and regular rhythm.   Pulmonary/Chest: Effort normal and breath sounds normal.  Abd:  Soft, NT, non-distended, + BS, no bruits Neurological: Pt is alert. Not confused  Skin: Skin is warm. No erythema.  Psychiatric: Pt behavior is normal. Thought content normal.     Assessment & Plan:

## 2013-07-05 NOTE — Patient Instructions (Signed)
Ok to stop the amlodipine 10 mg, and the lisinopril-HCT Please take all new medication as prescribed - the Tribenzor 40-10-25 mg - 1 per day Please continue all other medications as before Please have the pharmacy call with any other refills you may need.  Please return in 3 wks, or sooner if needed

## 2013-07-05 NOTE — Progress Notes (Signed)
Pre-visit discussion using our clinic review tool. No additional management support is needed unless otherwise documented below in the visit note.  

## 2013-07-05 NOTE — Assessment & Plan Note (Signed)
stable overall by history and exam, recent data reviewed with pt, and pt to continue medical treatment as before,  to f/u any worsening symptoms or concerns Lab Results  Component Value Date   HGBA1C 6.1 05/19/2013   For contd diet and wt loss

## 2013-07-26 ENCOUNTER — Ambulatory Visit (INDEPENDENT_AMBULATORY_CARE_PROVIDER_SITE_OTHER): Payer: 59 | Admitting: Internal Medicine

## 2013-07-26 ENCOUNTER — Encounter: Payer: Self-pay | Admitting: Internal Medicine

## 2013-07-26 VITALS — BP 122/82 | HR 78 | Temp 97.0°F | Ht 74.0 in | Wt 250.0 lb

## 2013-07-26 DIAGNOSIS — I10 Essential (primary) hypertension: Secondary | ICD-10-CM

## 2013-07-26 DIAGNOSIS — R7309 Other abnormal glucose: Secondary | ICD-10-CM

## 2013-07-26 DIAGNOSIS — R7302 Impaired glucose tolerance (oral): Secondary | ICD-10-CM

## 2013-07-26 DIAGNOSIS — E785 Hyperlipidemia, unspecified: Secondary | ICD-10-CM

## 2013-07-26 MED ORDER — OMEPRAZOLE 20 MG PO CPDR: 20.0000 mg | DELAYED_RELEASE_CAPSULE | Freq: Every day | ORAL | Status: DC | PRN

## 2013-07-26 NOTE — Assessment & Plan Note (Signed)
stable overall by history and exam, recent data reviewed with pt, and pt to continue medical treatment as before,  to f/u any worsening symptoms or concerns BP Readings from Last 3 Encounters:  07/26/13 122/82  07/05/13 162/100  07/04/13 155/100

## 2013-07-26 NOTE — Progress Notes (Signed)
Pre-visit discussion using our clinic review tool. No additional management support is needed unless otherwise documented below in the visit note.  

## 2013-07-26 NOTE — Assessment & Plan Note (Signed)
stable overall by history and exam, recent data reviewed with pt, and pt to continue medical treatment as before,  to f/u any worsening symptoms or concerns Lab Results  Component Value Date   HGBA1C 6.1 05/19/2013

## 2013-07-26 NOTE — Patient Instructions (Addendum)
Please continue all other medications as before, and refills have been done if requested. Please have the pharmacy call with any other refills you may need. Please call to have the medication changed if too expensive  Please remember to sign up for My Chart if you have not done so, as this will be important to you in the future with finding out test results, communicating by private email, and scheduling acute appointments online when needed.  Please return in 6 months, or sooner if needed

## 2013-07-26 NOTE — Progress Notes (Signed)
   Subjective:    Patient ID: Peter Kent, male    DOB: 01-Aug-1956, 57 y.o.   MRN: [id number]  HPI  Here to f/u; overall doing ok,  Pt denies chest pain, increased sob or doe, wheezing, orthopnea, PND, increased LE swelling, palpitations, dizziness or syncope.  Pt denies polydipsia, polyuria, or low sugar symptoms such as weakness or confusion improved with po intake.  Pt denies new neurological symptoms such as new headache, or facial or extremity weakness or numbness.   Pt states overall good compliance with meds, has been trying to follow lower cholesterol diet, with wt overall stable,  Tolerating meds well with good compliance Past Medical History  Diagnosis Date  . ERECTILE DYSFUNCTION 04/08/2007  . HEAD TRAUMA, CLOSED 04/08/2007  . HYPERCHOLESTEROLEMIA 04/08/2007  . HYPERLIPIDEMIA 04/11/2007  . HYPERTENSION 04/08/2007  . Morbid obesity 04/08/2007  . Preventative health care 03/22/2011  . Impaired glucose tolerance 07/05/2013   No past surgical history on file.  reports that he has never smoked. He has never used smokeless tobacco. He reports that he does not drink alcohol or use illicit drugs. family history includes Cancer in his other; Diabetes in his other; Heart disease in his other; Hypertension in his other. No Known Allergies Current Outpatient Prescriptions on File Prior to Visit  Medication Sig Dispense Refill  . aspirin 81 MG tablet Take 81 mg by mouth daily.        Marland Kitchen labetalol (NORMODYNE) 200 MG tablet TAKE TWO TABLETS BY MOUTH TWICE DAILY  360 tablet  3  . lovastatin (MEVACOR) 20 MG tablet Take 1 tablet (20 mg total) by mouth 2 (two) times daily.  180 tablet  3  . Olmesartan-Amlodipine-HCTZ (TRIBENZOR) 40-10-25 MG TABS 1 tab by mouth per day  90 tablet  3  . ondansetron (ZOFRAN) 4 MG tablet Take 1 tablet (4 mg total) by mouth every 6 (six) hours.  6 tablet  0   No current facility-administered medications on file prior to visit.   Review of Systems  Constitutional:  Negative for unexpected weight change, or unusual diaphoresis  HENT: Negative for tinnitus.   Eyes: Negative for photophobia and visual disturbance.  Respiratory: Negative for choking and stridor.   Gastrointestinal: Negative for vomiting and blood in stool.  Genitourinary: Negative for hematuria and decreased urine volume.  Musculoskeletal: Negative for acute joint swelling Skin: Negative for color change and wound.  Neurological: Negative for tremors and numbness other than noted  Psychiatric/Behavioral: Negative for decreased concentration or  hyperactivity.        Objective:   Physical Exam BP 122/82  Pulse 78  Temp(Src) 97 F (36.1 C) (Oral)  Ht 6\' 2"  (1.88 m)  Wt 250 lb (113.399 kg)  BMI 32.08 kg/m2  SpO2 96% VS noted,  Constitutional: Pt appears well-developed and well-nourished.  HENT: Head: NCAT.  Right Ear: External ear normal.  Left Ear: External ear normal.  Eyes: Conjunctivae and EOM are normal. Pupils are equal, round, and reactive to light.  Neck: Normal range of motion. Neck supple.  Cardiovascular: Normal rate and regular rhythm.   Pulmonary/Chest: Effort normal and breath sounds normal.  Abd:  Soft, NT, non-distended, + BS Neurological: Pt is alert. Not confused  Skin: Skin is warm. No erythema.  Psychiatric: Pt behavior is normal. Thought content normal.     Assessment & Plan:

## 2013-07-26 NOTE — Assessment & Plan Note (Signed)
stable overall by history and exam, recent data reviewed with pt, and pt to continue medical treatment as before,  to f/u any worsening symptoms or concerns Lab Results  Component Value Date   LDLCALC 133* 05/18/2013

## 2013-08-17 ENCOUNTER — Encounter: Payer: Self-pay | Admitting: Internal Medicine

## 2013-08-30 ENCOUNTER — Telehealth: Payer: Self-pay | Admitting: *Deleted

## 2013-08-30 NOTE — Telephone Encounter (Signed)
Patient phoned requesting samples for tribenzor.  Enough for 6 weeks in bin awaiting p/u & patient aware.

## 2013-09-03 ENCOUNTER — Encounter (HOSPITAL_COMMUNITY): Payer: Self-pay | Admitting: Emergency Medicine

## 2013-09-03 ENCOUNTER — Emergency Department (HOSPITAL_COMMUNITY): Payer: 59

## 2013-09-03 ENCOUNTER — Emergency Department (HOSPITAL_COMMUNITY)
Admission: EM | Admit: 2013-09-03 | Discharge: 2013-09-03 | Disposition: A | Discharge status: Home / Self Care | Payer: 59 | Attending: Emergency Medicine | Admitting: Emergency Medicine

## 2013-09-03 DIAGNOSIS — K429 Umbilical hernia without obstruction or gangrene: Secondary | ICD-10-CM

## 2013-09-03 DIAGNOSIS — E785 Hyperlipidemia, unspecified: Secondary | ICD-10-CM | POA: Insufficient documentation

## 2013-09-03 DIAGNOSIS — R112 Nausea with vomiting, unspecified: Secondary | ICD-10-CM | POA: Insufficient documentation

## 2013-09-03 DIAGNOSIS — E7800 Pure hypercholesterolemia, unspecified: Secondary | ICD-10-CM | POA: Insufficient documentation

## 2013-09-03 DIAGNOSIS — Z79899 Other long term (current) drug therapy: Secondary | ICD-10-CM | POA: Insufficient documentation

## 2013-09-03 DIAGNOSIS — I10 Essential (primary) hypertension: Secondary | ICD-10-CM | POA: Insufficient documentation

## 2013-09-03 DIAGNOSIS — Z87448 Personal history of other diseases of urinary system: Secondary | ICD-10-CM | POA: Insufficient documentation

## 2013-09-03 DIAGNOSIS — Z7982 Long term (current) use of aspirin: Secondary | ICD-10-CM | POA: Insufficient documentation

## 2013-09-03 DIAGNOSIS — Z87828 Personal history of other (healed) physical injury and trauma: Secondary | ICD-10-CM | POA: Insufficient documentation

## 2013-09-03 NOTE — ED Provider Notes (Signed)
CSN: [id number]     Arrival date & time 09/03/13  [id number] History   First MD Initiated Contact with Patient 09/03/13 [phone]     Chief Complaint  Patient presents with  . Abdominal Pain   (Consider location/radiation/quality/duration/timing/severity/associated sxs/prior Treatment) HPI Peter Kent is a 58 y.o. male who presents emergency department complaining of abdominal pain and umbilical hernia. Patient states he has had periumbilical hernia for over a year. States it pops in and out. States normally he is able to push it back in. States since yesterday morning he hasn't been able to push it in. This morning it became painful, he had associated nausea, vomited times one. He states he has had normal bowel movement yesterday, none today. He denies any fever. He denies any other associated symptoms. Pain does not radiate. Denies prior surgeries on his abdomen. Did not take any pain medications prior to coming in.  Past Medical History  Diagnosis Date  . ERECTILE DYSFUNCTION 04/08/2007  . HEAD TRAUMA, CLOSED 04/08/2007  . HYPERCHOLESTEROLEMIA 04/08/2007  . HYPERLIPIDEMIA 04/11/2007  . HYPERTENSION 04/08/2007  . Morbid obesity 04/08/2007  . Preventative health care 03/22/2011  . Impaired glucose tolerance 07/05/2013   No past surgical history on file. Family History  Problem Relation Age of Onset  . Hypertension Other   . Heart disease Other   . Diabetes Other   . Cancer Other     breast cancer   History  Substance Use Topics  . Smoking status: Never Smoker   . Smokeless tobacco: Never Used  . Alcohol Use: No    Review of Systems  Constitutional: Negative for fever and chills.  Respiratory: Negative for cough, chest tightness and shortness of breath.   Cardiovascular: Negative for chest pain, palpitations and leg swelling.  Gastrointestinal: Positive for abdominal pain. Negative for nausea, vomiting, diarrhea, constipation, abdominal distention and rectal pain.  Genitourinary:  Negative for dysuria, urgency, frequency and hematuria.  Musculoskeletal: Negative for arthralgias, myalgias, neck pain and neck stiffness.  Skin: Negative for rash.  Allergic/Immunologic: Negative for immunocompromised state.  Neurological: Negative for dizziness, weakness, light-headedness, numbness and headaches.    Allergies  Review of patient's allergies indicates no known allergies.  Home Medications   Current Outpatient Rx  Name  Route  Sig  Dispense  Refill  . aspirin 81 MG tablet   Oral   Take 81 mg by mouth daily.           Marland Kitchen. labetalol (NORMODYNE) 200 MG tablet   Oral   Take 400 mg by mouth 2 (two) times daily.         Marland Kitchen. lovastatin (MEVACOR) 20 MG tablet   Oral   Take 40 mg by mouth at bedtime.         . Olmesartan-Amlodipine-HCTZ (TRIBENZOR) 40-10-25 MG TABS   Oral   Take 1 tablet by mouth daily.         Marland Kitchen. omeprazole (PRILOSEC) 20 MG capsule   Oral   Take 1 capsule (20 mg total) by mouth daily as needed. As needed   90 capsule   3   . ondansetron (ZOFRAN) 4 MG tablet   Oral   Take 1 tablet (4 mg total) by mouth every 6 (six) hours.   6 tablet   0    BP 179/122  Pulse 108  Temp(Src) 97.5 F (36.4 C) (Oral)  Resp 16  SpO2 100% Physical Exam  Nursing note and vitals reviewed. Constitutional: He is oriented to person, place,  and time. He appears well-developed and well-nourished. No distress.  HENT:  Head: Normocephalic and atraumatic.  Eyes: Conjunctivae are normal.  Neck: Neck supple.  Cardiovascular: Normal rate, regular rhythm and normal heart sounds.   Pulmonary/Chest: Effort normal. No respiratory distress. He has no wheezes. He has no rales.  Abdominal: Soft. Bowel sounds are normal. There is tenderness. There is no rebound and no guarding.  Periumbilical hernia. Soft. Reduced by me.   Musculoskeletal: He exhibits no edema.  Neurological: He is alert and oriented to person, place, and time.  Skin: Skin is warm and dry.    ED  Course  Procedures (including critical care time) Labs Review Labs Reviewed - No data to display Imaging Review Dg Abd 2 Views  09/03/2013   CLINICAL DATA:  Nonreducible umbilical hernia. Vomiting and abdominal pain.  EXAM: ABDOMEN - 2 VIEW  COMPARISON:  No priors.  FINDINGS: Dilated loops of small bowel are noted in the central abdomen, measuring up to 5 cm in diameter. Air-fluid levels are noted on the upright projection within these dilated loops of small bowel. There is a paucity of colonic gas. No [place] pneumoperitoneum.  IMPRESSION: 1. Findings, as above, compatible with early or partial small bowel obstruction.   Electronically Signed   By: Trudie Reed M.D.   On: 09/03/2013 10:40    EKG Interpretation   None       MDM   1. Umbilical hernia     Pt with periumbilical hernia, I was able to reduce it in ED. Pt continued to have abdominal pain. X-ray obtained.    11:50 AM Xray abdomen showed possible early small bowel obstruction. Discussed with Dr. Ignacia Felling who examined pt as well. Pt's abdominal pain improved, and now he denies any symptoms. Suspect possible obstruction due to incarcerated hernia which has now resolved. Pt has no vomiting, no pain, abdomen is benign at this time. Will d/c home with careful precautions to return if worsening. Referral to general surgery for elective hernia repair. Pt also found to be hypertensive. Will recheck.   Filed Vitals:   09/03/13 0933 09/03/13 1209  BP: 179/122 157/93  Pulse: 108 78  Temp: 97.5 F (36.4 C) 97.6 F (36.4 C)  TempSrc: Oral Oral  Resp: 16 21  SpO2: 100% 97%     Lottie Mussel, PA-C 09/03/13 1617

## 2013-09-03 NOTE — ED Notes (Signed)
Pt c/o umbilical hernia x over 1 year.  States that yesterday, it is no longer reducible.  States he was vomiting from the pain.

## 2013-09-03 NOTE — ED Provider Notes (Signed)
Medical screening examination/treatment/procedure(s) were conducted as a shared visit with non-physician practitioner(s) and myself.  I personally evaluated the patient during the encounter.  EKG Interpretation   None         Candyce ChurnJohn David Davone Shinault, MD 09/03/13 [id number]

## 2013-09-03 NOTE — ED Notes (Signed)
Patient transported to X-ray 

## 2013-09-03 NOTE — Discharge Instructions (Signed)
If hernia pops out again, try to push it in yourself. If difficulty pushing it in, try ice packs. If unable to reduce it at home, return to ER. Also return if increased pain, vomiting, fever, unable to have a bowel movement. Please follow up with general surgery to be evaluated for hernia repair surgery.    Hernia A hernia occurs when an internal organ pushes out through a weak spot in the abdominal wall. Hernias most commonly occur in the groin and around the navel. Hernias often can be pushed back into place (reduced). Most hernias tend to get worse over time. Some abdominal hernias can get stuck in the opening (irreducible or incarcerated hernia) and cannot be reduced. An irreducible abdominal hernia which is tightly squeezed into the opening is at risk for impaired blood supply (strangulated hernia). A strangulated hernia is a medical emergency. Because of the risk for an irreducible or strangulated hernia, surgery may be recommended to repair a hernia. CAUSES   Heavy lifting.  Prolonged coughing.  Straining to have a bowel movement.  A cut (incision) made during an abdominal surgery. HOME CARE INSTRUCTIONS   Bed rest is not required. You may continue your normal activities.  Avoid lifting more than 10 pounds (4.5 kg) or straining.  Cough gently. If you are a smoker it is best to stop. Even the best hernia repair can break down with the continual strain of coughing. Even if you do not have your hernia repaired, a cough will continue to aggravate the problem.  Do not wear anything tight over your hernia. Do not try to keep it in with an outside bandage or truss. These can damage abdominal contents if they are trapped within the hernia sac.  Eat a normal diet.  Avoid constipation. Straining over long periods of time will increase hernia size and encourage breakdown of repairs. If you cannot do this with diet alone, stool softeners may be used. SEEK IMMEDIATE MEDICAL CARE IF:   You have  a fever.  You develop increasing abdominal pain.  You feel nauseous or vomit.  Your hernia is stuck outside the abdomen, looks discolored, feels hard, or is tender.  You have any changes in your bowel habits or in the hernia that are unusual for you.  You have increased pain or swelling around the hernia.  You cannot push the hernia back in place by applying gentle pressure while lying down. MAKE SURE YOU:   Understand these instructions.  Will watch your condition.  Will get help right away if you are not doing well or get worse. Document Released: 07/28/2005 Document Revised: 10/20/2011 Document Reviewed: 03/16/2008 [facility]ExitCare Patient Information 2014 [place], [place].

## 2013-09-03 NOTE — ED Provider Notes (Signed)
Medical screening examination/treatment/procedure(s) were conducted as a shared visit with non-physician practitioner(s) and myself.  I personally evaluated the patient during the encounter.  EKG Interpretation   None       58 yo male with umbilical hernia presenting with pain associated with hernia.  Hernia reduced successfully prior to my evaluation by PT Kirichenko.  On my exam, hernia completely reduced, abdomen soft and nontender.  Pt very well appearing.  Clinical picture not consistent with partial SBO.  He appears safe and reliable for outpatient management.  Return precautions given.   Clinical Impression: 1. Umbilical hernia       Candyce ChurnJohn David Clayton Bosserman, MD 09/03/13 [phone]

## 2013-09-20 ENCOUNTER — Encounter (INDEPENDENT_AMBULATORY_CARE_PROVIDER_SITE_OTHER): Payer: Self-pay | Admitting: General Surgery

## 2013-09-20 ENCOUNTER — Ambulatory Visit (INDEPENDENT_AMBULATORY_CARE_PROVIDER_SITE_OTHER): Payer: 59 | Admitting: General Surgery

## 2013-09-20 VITALS — BP 150/94 | HR 66 | Temp 98.7°F | Resp 16 | Ht 74.0 in | Wt 250.6 lb

## 2013-09-20 DIAGNOSIS — K429 Umbilical hernia without obstruction or gangrene: Secondary | ICD-10-CM

## 2013-09-20 NOTE — Progress Notes (Signed)
Patient ID: Peter Kent, male   DOB: 1956/01/29, 58 y.o.   MRN: [id number]  Chief Complaint  Patient presents with  . Umbilical Hernia    HPI Peter Kent is a 58 y.o. male.  He is referred by Gabriel Earing, emergency department physician, for evaluation of symptomatic umbilical hernia. Dr. Oliver Barre is his PCP. I have operated on his wife for a more complex ventral hernia in the past.  He gives a six-month history of a bulge at his umbilicus with minimal pain. He went to the emergency department on 09/03/2013 because the hernia got hard and he vomited once. He states that they easily pushed it back in and he has felt reasonably well since that time. He has no prior history of any hernia.  Comorbidities include history of head trauma, hypertension, obesity, and hyperlipidemia. He takes aspirin, labetalol, lovastatin, and tribenzor.  HPI  Past Medical History  Diagnosis Date  . ERECTILE DYSFUNCTION 04/08/2007  . HEAD TRAUMA, CLOSED 04/08/2007  . HYPERCHOLESTEROLEMIA 04/08/2007  . HYPERLIPIDEMIA 04/11/2007  . HYPERTENSION 04/08/2007  . Morbid obesity 04/08/2007  . Preventative health care 03/22/2011  . Impaired glucose tolerance 07/05/2013    History reviewed. No pertinent past surgical history.  Family History  Problem Relation Age of Onset  . Hypertension Other   . Heart disease Other   . Diabetes Other   . Cancer Other     breast cancer    Social History History  Substance Use Topics  . Smoking status: Never Smoker   . Smokeless tobacco: Never Used  . Alcohol Use: No    No Known Allergies  Current Outpatient Prescriptions  Medication Sig Dispense Refill  . aspirin 81 MG tablet Take 81 mg by mouth daily.        Marland Kitchen labetalol (NORMODYNE) 200 MG tablet Take 400 mg by mouth 2 (two) times daily.      Marland Kitchen lovastatin (MEVACOR) 20 MG tablet Take 40 mg by mouth at bedtime.      . Olmesartan-Amlodipine-HCTZ (TRIBENZOR) 40-10-25 MG TABS Take 1 tablet by mouth daily.      Marland Kitchen  omeprazole (PRILOSEC) 20 MG capsule Take 1 capsule (20 mg total) by mouth daily as needed. As needed  90 capsule  3  . ondansetron (ZOFRAN) 4 MG tablet Take 1 tablet (4 mg total) by mouth every 6 (six) hours.  6 tablet  0   No current facility-administered medications for this visit.    Review of Systems Review of Systems  Constitutional: Negative for fever, chills and unexpected weight change.  HENT: Negative for congestion, hearing loss, sore throat, trouble swallowing and voice change.   Eyes: Negative for visual disturbance.  Respiratory: Negative for cough and wheezing.   Cardiovascular: Negative for chest pain, palpitations and leg swelling.  Gastrointestinal: Positive for abdominal pain. Negative for nausea, vomiting, diarrhea, constipation, blood in stool, abdominal distention, anal bleeding and rectal pain.  Genitourinary: Negative for hematuria and difficulty urinating.  Musculoskeletal: Negative for arthralgias.  Skin: Negative for rash and wound.  Neurological: Negative for seizures, syncope, weakness and headaches.  Hematological: Negative for adenopathy. Does not bruise/bleed easily.  Psychiatric/Behavioral: Negative for confusion.    Blood pressure 150/94, pulse 66, temperature 98.7 F (37.1 C), temperature source Temporal, resp. rate 16, height 6\' 2"  (1.88 m), weight 250 lb 9.6 oz (113.671 kg).  Physical Exam Physical Exam  Constitutional: He is oriented to person, place, and time. He appears well-developed and well-nourished. No distress.  HENT:  Head: Normocephalic.  Nose: Nose normal.  Mouth/Throat: No oropharyngeal exudate.  Eyes: Conjunctivae and EOM are normal. Pupils are equal, round, and reactive to light. Right eye exhibits no discharge. Left eye exhibits no discharge. No scleral icterus.  Neck: Normal range of motion. Neck supple. No JVD present. No tracheal deviation present. No thyromegaly present.  Cardiovascular: Normal rate, regular rhythm, normal  heart sounds and intact distal pulses.   No murmur heard. Pulmonary/Chest: Effort normal and breath sounds normal. No stridor. No respiratory distress. He has no wheezes. He has no rales. He exhibits no tenderness.  Abdominal: Soft. Bowel sounds are normal. He exhibits no distension and no mass. There is no tenderness. There is no rebound and no guarding.  Obvious umbilical hernia. Hernia sac 4 cm. Defect probably 2 cm. Skin healthy. Reducible. No evidence of inguinal hernia. No other scars.  Musculoskeletal: Normal range of motion. He exhibits no edema and no tenderness.  Lymphadenopathy:    He has no cervical adenopathy.  Neurological: He is alert and oriented to person, place, and time. He has normal reflexes. Coordination normal.  Skin: Skin is warm and dry. No rash noted. He is not diaphoretic. No erythema. No pallor.  Psychiatric: He has a normal mood and affect. His behavior is normal. Judgment and thought content normal.    Data Reviewed ED records.  Assessment    Moderate-sized umbilical hernia, reducible. History suggest brief incarceration. At risk for progression.  History head trauma  Hypertension  Obesity  Hyperlipidemia     Plan    He would like to have this repaired. I think he is a good candidate for anterior approach with infraumbilical incision and mesh disc. I described this to him in detail.  He will be scheduled for open repair of umbilical hernia with mesh in the near future.  I discussed indications and details of the surgery with him. I discussed the techniques and risks. He is aware of the risk of bleeding, infection, recurrence, injury to the intestine with major reconstructive surgery, chronic pain, and other unforeseen problems. He understands these issues well and all his questions are answered. He agrees with this plan.        Angelia MouldHaywood M. Derrell LollingIngram, M.D., [facility] General and Minimally invasive Surgery Breast and  Colorectal Surgery Office:   [phone] Pager:   [phone]  09/20/2013, 3:22 PM

## 2013-09-20 NOTE — Patient Instructions (Signed)
You have a moderate to large sized umbilical hernia. This may have been incarcerated and blocking you on the day you went to the emergency room.  We have discussed repairing this hernia and reinforcing the repair with mesh. You has stated that he would like to go ahead and plan that surgery now  You will be  scheduled for repair of your umbilical hernia with mesh in the near future.  This will be an outpatient surgery. You will be able to go home the same day.     Umbilical Herniorrhaphy Herniorrhaphy is surgery to repair a hernia. A hernia is the protrusion of a part of an organ through an abdominal opening. An umbilical hernia means that your hernia is in the area around your navel. If the hernia is not repaired, the gap could get bigger. Your intestines or other tissues, such as fat, could get trapped in the gap. This can lead to other health problems, such as blocked intestines. If the hernia is fixed before problems set in, you may be allowed to go home the same day as the surgery (outpatient). LET YOUR CAREGIVER KNOW ABOUT:  Allergies to food or medicine.  Medicines taken, including vitamins, herbs, eyedrops, over-the-counter medicines, and creams.  Use of steroids (by mouth or creams).  Previous problems with anesthetics or numbing medicines.  History of bleeding problems or blood clots.  Previous surgery.  Other health problems, including diabetes and kidney problems.  Possibility of pregnancy, if this applies. RISKS AND COMPLICATIONS  Pain.  Excessive bleeding.  Hematoma. This is a pocket of blood that collects under the surgery site.  Infection at the surgery site.  Numbness at the surgery site.  Swelling and bruising.  Blood clots.  Intestinal damage (rare).  Scarring.  Skin damage.  Development of another hernia. This may require another surgery. BEFORE THE PROCEDURE  Ask your caregiver about changing or stopping your regular medicines. You may need  to stop taking aspirin, nonsteroidal anti-inflammatory drugs (NSAIDs), vitamin E, and blood thinners as early as 2 weeks before the procedure.  Do not eat or drink for 8 hours before the procedure, or as directed by your caregiver.  You might be asked to shower or wash with an antibacterial soap before the procedure.  Wear comfortable clothes that will be easy to put on after the procedure. PROCEDURE You will be given an intravenous (IV) tube. A needle will be inserted in your arm. Medicine will flow directly into your body through this needle. You might be given medicine to help you relax (sedative). You will be given medicine that numbs the area (local anesthetic) or medicine that makes you sleep (general anesthetic). If you have open surgery:  The surgeon will make a cut (incision) in your abdomen.  The gap in the muscle wall will be repaired. The surgeon may sew the edges together over the gap or use a mesh material to strengthen the area. When mesh is used, the body grows new, strong tissue into and around it. This new tissue closes the gap.  A drain might be put in to remove excess fluid from the body after surgery.  The surgeon will close the incision with stitches, glue, or staples. If you have laparoscopic surgery:  The surgeon will make several small incisions in your abdomen.  A thin, lighted tube (laparoscope) will be inserted into the abdomen through an incision. A camera is attached to the laparoscope that allows the surgeon to see inside the abdomen.  Tools  will be inserted through the other incisions to repair the hernia. Usually, mesh is used to cover the gap.  The surgeon will close the incisions with stitches. AFTER THE PROCEDURE  You will be taken to a recovery area. A nurse will watch and check your progress.  When you are awake, feeling well, and taking fluids well, you may be allowed to go home. In some cases, you may need to stay overnight in the  hospital.  Arrange for someone to drive you home. Document Released: 10/24/2008 Document Revised: 01/27/2012 Document Reviewed: 10/29/2011 [facility] Patient Information 2014 [employer], [place].

## 2013-10-13 ENCOUNTER — Encounter (HOSPITAL_BASED_OUTPATIENT_CLINIC_OR_DEPARTMENT_OTHER): Payer: Self-pay | Admitting: *Deleted

## 2013-10-13 NOTE — Progress Notes (Signed)
10/13/13 1503  OBSTRUCTIVE SLEEP APNEA  Have you ever been diagnosed with sleep apnea through a sleep study? No  Do you snore loudly (loud enough to be heard through closed doors)?  0  Do you often feel tired, fatigued, or sleepy during the daytime? 0  Has anyone observed you stop breathing during your sleep? 0  Do you have, or are you being treated for high blood pressure? 1  BMI more than 35 kg/m2? 0  Age over 58 years old? 1  Neck circumference greater than 40 cm/18 inches? 1  Gender: 1  Obstructive Sleep Apnea Score 4  Score 4 or greater  Results sent to PCP

## 2013-10-13 NOTE — Progress Notes (Signed)
Denies any cardiac,resp, or sleep apnea-to come in for labs and ekg

## 2013-10-17 ENCOUNTER — Encounter: Payer: 59 | Admitting: Internal Medicine

## 2013-10-17 ENCOUNTER — Encounter (HOSPITAL_BASED_OUTPATIENT_CLINIC_OR_DEPARTMENT_OTHER)
Admission: RE | Admit: 2013-10-17 | Discharge: 2013-10-17 | Disposition: A | Discharge status: Home / Self Care | Payer: 59 | Source: Ambulatory Visit | Attending: General Surgery | Admitting: General Surgery

## 2013-10-17 LAB — CBC WITH DIFFERENTIAL/PLATELET
BASOS ABS: 0.1 10*3/uL (ref 0.0–0.1)
Basophils Relative: 1 % (ref 0–1)
EOS PCT: 0 % (ref 0–5)
Eosinophils Absolute: 0 10*3/uL (ref 0.0–0.7)
HCT: 38 % — ABNORMAL LOW (ref 39.0–52.0)
Hemoglobin: 12.9 g/dL — ABNORMAL LOW (ref 13.0–17.0)
Lymphocytes Relative: 26 % (ref 12–46)
Lymphs Abs: 2.1 10*3/uL (ref 0.7–4.0)
MCH: 29.4 pg (ref 26.0–34.0)
MCHC: 33.9 g/dL (ref 30.0–36.0)
MCV: 86.6 fL (ref 78.0–100.0)
MONOS PCT: 10 % (ref 3–12)
Monocytes Absolute: 0.8 10*3/uL (ref 0.1–1.0)
Neutro Abs: 5.1 10*3/uL (ref 1.7–7.7)
Neutrophils Relative %: 63 % (ref 43–77)
PLATELETS: 227 10*3/uL (ref 150–400)
RBC: 4.39 MIL/uL (ref 4.22–5.81)
RDW: 13.9 % (ref 11.5–15.5)
WBC: 8.1 10*3/uL (ref 4.0–10.5)

## 2013-10-17 LAB — COMPREHENSIVE METABOLIC PANEL
ALBUMIN: 3.4 g/dL — AB (ref 3.5–5.2)
ALK PHOS: 66 U/L (ref 39–117)
ALT: 23 U/L (ref 0–53)
AST: 26 U/L (ref 0–37)
BILIRUBIN TOTAL: 0.5 mg/dL (ref 0.3–1.2)
BUN: 15 mg/dL (ref 6–23)
CHLORIDE: 102 meq/L (ref 96–112)
CO2: 25 mEq/L (ref 19–32)
CREATININE: 0.99 mg/dL (ref 0.50–1.35)
Calcium: 8.9 mg/dL (ref 8.4–10.5)
GFR calc Af Amer: >90 mL/min (ref >90)
GFR calc non Af Amer: 89 mL/min — ABNORMAL LOW (ref >90)
Glucose, Bld: 112 mg/dL — ABNORMAL HIGH (ref 70–99)
POTASSIUM: 4.1 meq/L (ref 3.7–5.3)
SODIUM: 141 meq/L (ref 137–147)
Total Protein: 7.2 g/dL (ref 6.0–8.3)

## 2013-10-17 NOTE — H&P (Signed)
[name]   MRN:  [id number]   Description: 58 year old male  Provider: Ernestene Mention, MD  Department: [facility]                    Diagnoses      Umbilical hernia    -  Primary      553.1             Current Vitals - Last Recorded      BP Pulse Temp(Src) Resp Ht Wt      150/94 66 98.7 F (37.1 C) (Temporal) 16 6\' 2"  (1.88 m) 250 lb 9.6 oz (113.671 kg)     BMI 32.16 kg/m2                      History and Physical   Ernestene Mention, MD   Status: Signed            Patient ID: Peter Kent, male   DOB: 06-Feb-1956, 58 y.o.   MRN: [id number]             HPI [name] is a 58 y.o. male.  He is referred by Gabriel Earing, emergency department physician, for evaluation of symptomatic umbilical hernia. Dr. Oliver Barre is his PCP. I have operated on his wife for a more complex ventral hernia in the past.   He gives a six-month history of a bulge at his umbilicus with minimal pain. He went to the emergency department on 09/03/2013 because the hernia got hard and he vomited once. He states that they easily pushed it back in and he has felt reasonably well since that time. He has no prior history of any hernia.   Comorbidities include history of head trauma, hypertension, obesity, and hyperlipidemia. He takes aspirin, labetalol, lovastatin, and tribenzor.         Past Medical History   Diagnosis  Date   .  ERECTILE DYSFUNCTION  04/08/2007   .  HEAD TRAUMA, CLOSED  04/08/2007   .  HYPERCHOLESTEROLEMIA  04/08/2007   .  HYPERLIPIDEMIA  04/11/2007   .  HYPERTENSION  04/08/2007   .  Morbid obesity  04/08/2007   .  Preventative health care  03/22/2011   .  Impaired glucose tolerance  07/05/2013        History reviewed. No pertinent past surgical history.    Family History   Problem  Relation  Age of Onset   .  Hypertension  Other     .  Heart disease  Other     .  Diabetes  Other     .  Cancer  Other         breast cancer         Social History History   Substance Use Topics   .  Smoking status:  Never Smoker    .  Smokeless tobacco:  Never Used   .  Alcohol Use:  No        No Known Allergies    Current Outpatient Prescriptions   Medication  Sig  Dispense  Refill   .  aspirin 81 MG tablet  Take 81 mg by mouth daily.           Marland Kitchen  labetalol (NORMODYNE) 200 MG tablet  Take 400 mg by mouth 2 (two) times daily.         Marland Kitchen  lovastatin (MEVACOR) 20 MG tablet  Take 40 mg by mouth at bedtime.         .  Olmesartan-Amlodipine-HCTZ (TRIBENZOR) 40-10-25 MG TABS  Take 1 tablet by mouth daily.         Marland Kitchen  omeprazole (PRILOSEC) 20 MG capsule  Take 1 capsule (20 mg total) by mouth daily as needed. As needed   90 capsule   3   .  ondansetron (ZOFRAN) 4 MG tablet  Take 1 tablet (4 mg total) by mouth every 6 (six) hours.   6 tablet   0               Review of Systems  Constitutional: Negative for fever, chills and unexpected weight change.  HENT: Negative for congestion, hearing loss, sore throat, trouble swallowing and voice change.   Eyes: Negative for visual disturbance.  Respiratory: Negative for cough and wheezing.   Cardiovascular: Negative for chest pain, palpitations and leg swelling.  Gastrointestinal: Positive for abdominal pain. Negative for nausea, vomiting, diarrhea, constipation, blood in stool, abdominal distention, anal bleeding and rectal pain.  Genitourinary: Negative for hematuria and difficulty urinating.  Musculoskeletal: Negative for arthralgias.  Skin: Negative for rash and wound.  Neurological: Negative for seizures, syncope, weakness and headaches.  Hematological: Negative for adenopathy. Does not bruise/bleed easily.  Psychiatric/Behavioral: Negative for confusion.      Blood pressure 150/94, pulse 66, temperature 98.7 F (37.1 C), temperature source Temporal, resp. rate 16, height 6\' 2"  (1.88 m), weight 250 lb 9.6 oz (113.671 kg).   Physical Exam   Constitutional: He is  oriented to person, place, and time. He appears well-developed and well-nourished. No distress.  HENT:   Head: Normocephalic.   Nose: Nose normal.   Mouth/Throat: No oropharyngeal exudate.  Eyes: Conjunctivae and EOM are normal. Pupils are equal, round, and reactive to light. Right eye exhibits no discharge. Left eye exhibits no discharge. No scleral icterus.  Neck: Normal range of motion. Neck supple. No JVD present. No tracheal deviation present. No thyromegaly present.  Cardiovascular: Normal rate, regular rhythm, normal heart sounds and intact distal pulses.    No murmur heard. Pulmonary/Chest: Effort normal and breath sounds normal. No stridor. No respiratory distress. He has no wheezes. He has no rales. He exhibits no tenderness.  Abdominal: Soft. Bowel sounds are normal. He exhibits no distension and no mass. There is no tenderness. There is no rebound and no guarding.  Obvious umbilical hernia. Hernia sac 4 cm. Defect probably 2 cm. Skin healthy. Reducible. No evidence of inguinal hernia. No other scars.  Musculoskeletal: Normal range of motion. He exhibits no edema and no tenderness.  Lymphadenopathy:    He has no cervical adenopathy.  Neurological: He is alert and oriented to person, place, and time. He has normal reflexes. Coordination normal.  Skin: Skin is warm and dry. No rash noted. He is not diaphoretic. No erythema. No pallor.  Psychiatric: He has a normal mood and affect. His behavior is normal. Judgment and thought content normal.      Data Reviewed ED records.   Assessment    Moderate-sized umbilical hernia, reducible. History suggest brief incarceration. At risk for progression.   History head trauma   Hypertension   Obesity   Hyperlipidemia      Plan    He would like to have this repaired. I think he is a good candidate for anterior approach with infraumbilical incision and mesh disc. I described this to him in detail.   He will be scheduled for  open repair of umbilical hernia with mesh in the near future.   I discussed indications and details of the surgery with him. I discussed the techniques and risks. He is aware of the risk of bleeding, infection, recurrence, injury to the intestine with major reconstructive surgery, chronic pain, and other unforeseen problems. He understands these issues well and all his questions are answered. He agrees with this plan.           Angelia MouldHaywood M. Derrell LollingIngram, M.D., [facility] General and Minimally invasive Surgery Breast and Colorectal Surgery Office:   [phone] Pager:   [phone]

## 2013-10-19 ENCOUNTER — Encounter (HOSPITAL_BASED_OUTPATIENT_CLINIC_OR_DEPARTMENT_OTHER)
Admission: RE | Disposition: A | Discharge status: Home / Self Care | Payer: Self-pay | Source: Ambulatory Visit | Attending: General Surgery

## 2013-10-19 ENCOUNTER — Ambulatory Visit (HOSPITAL_BASED_OUTPATIENT_CLINIC_OR_DEPARTMENT_OTHER)
Admission: RE | Admit: 2013-10-19 | Discharge: 2013-10-19 | Disposition: A | Discharge status: Home / Self Care | Payer: 59 | Source: Ambulatory Visit | Attending: General Surgery | Admitting: General Surgery

## 2013-10-19 ENCOUNTER — Encounter (HOSPITAL_BASED_OUTPATIENT_CLINIC_OR_DEPARTMENT_OTHER): Payer: Self-pay | Admitting: Anesthesiology

## 2013-10-19 ENCOUNTER — Encounter (HOSPITAL_BASED_OUTPATIENT_CLINIC_OR_DEPARTMENT_OTHER): Payer: 59 | Admitting: Anesthesiology

## 2013-10-19 ENCOUNTER — Ambulatory Visit (HOSPITAL_BASED_OUTPATIENT_CLINIC_OR_DEPARTMENT_OTHER): Payer: 59 | Admitting: Anesthesiology

## 2013-10-19 DIAGNOSIS — E7800 Pure hypercholesterolemia, unspecified: Secondary | ICD-10-CM | POA: Insufficient documentation

## 2013-10-19 DIAGNOSIS — K429 Umbilical hernia without obstruction or gangrene: Secondary | ICD-10-CM | POA: Diagnosis present

## 2013-10-19 DIAGNOSIS — E785 Hyperlipidemia, unspecified: Secondary | ICD-10-CM | POA: Insufficient documentation

## 2013-10-19 DIAGNOSIS — K420 Umbilical hernia with obstruction, without gangrene: Secondary | ICD-10-CM

## 2013-10-19 DIAGNOSIS — E669 Obesity, unspecified: Secondary | ICD-10-CM | POA: Insufficient documentation

## 2013-10-19 HISTORY — DX: Complete loss of teeth, unspecified cause, unspecified class: K08.109

## 2013-10-19 HISTORY — PX: INSERTION OF MESH: SHX5868

## 2013-10-19 HISTORY — DX: Gastro-esophageal reflux disease without esophagitis: K21.9

## 2013-10-19 HISTORY — PX: UMBILICAL HERNIA REPAIR: SHX196

## 2013-10-19 HISTORY — DX: Presence of spectacles and contact lenses: Z97.3

## 2013-10-19 HISTORY — DX: Presence of dental prosthetic device (complete) (partial): Z97.2

## 2013-10-19 SURGERY — REPAIR, HERNIA, UMBILICAL, ADULT
Anesthesia: General | Site: Abdomen

## 2013-10-19 MED ORDER — FENTANYL CITRATE 0.05 MG/ML IJ SOLN
INTRAMUSCULAR | Status: AC
  Filled 2013-10-19: qty 4

## 2013-10-19 MED ORDER — CHLORHEXIDINE GLUCONATE 4 % EX LIQD: 1.0000 "application " | Freq: Once | CUTANEOUS | Status: DC

## 2013-10-19 MED ORDER — ROCURONIUM BROMIDE 100 MG/10ML IV SOLN
INTRAVENOUS | Status: DC | PRN
  Administered 2013-10-19: 50 mg via INTRAVENOUS

## 2013-10-19 MED ORDER — BUPIVACAINE-EPINEPHRINE 0.5% -1:200000 IJ SOLN
INTRAMUSCULAR | Status: DC | PRN
  Administered 2013-10-19: 10 mL

## 2013-10-19 MED ORDER — ONDANSETRON HCL 4 MG/2ML IJ SOLN: 4.0000 mg | Freq: Four times a day (QID) | INTRAMUSCULAR | Status: DC | PRN

## 2013-10-19 MED ORDER — ACETAMINOPHEN 325 MG PO TABS: 650.0000 mg | ORAL_TABLET | ORAL | Status: DC | PRN

## 2013-10-19 MED ORDER — DEXAMETHASONE SODIUM PHOSPHATE 4 MG/ML IJ SOLN
INTRAMUSCULAR | Status: DC | PRN
  Administered 2013-10-19: 10 mg via INTRAVENOUS

## 2013-10-19 MED ORDER — CEFAZOLIN SODIUM-DEXTROSE 2-3 GM-% IV SOLR
INTRAVENOUS | Status: AC
  Filled 2013-10-19: qty 50

## 2013-10-19 MED ORDER — FENTANYL CITRATE 0.05 MG/ML IJ SOLN: 50.0000 ug | INTRAMUSCULAR | Status: DC | PRN

## 2013-10-19 MED ORDER — BUPIVACAINE-EPINEPHRINE PF 0.5-1:200000 % IJ SOLN
INTRAMUSCULAR | Status: AC
  Filled 2013-10-19: qty 30

## 2013-10-19 MED ORDER — HYDROCODONE-ACETAMINOPHEN 5-325 MG PO TABS: 1.0000 | ORAL_TABLET | ORAL | Status: DC | PRN

## 2013-10-19 MED ORDER — FENTANYL CITRATE 0.05 MG/ML IJ SOLN: 25.0000 ug | INTRAMUSCULAR | Status: DC | PRN

## 2013-10-19 MED ORDER — MIDAZOLAM HCL 2 MG/2ML IJ SOLN
INTRAMUSCULAR | Status: AC
  Filled 2013-10-19: qty 2

## 2013-10-19 MED ORDER — OXYCODONE HCL 5 MG/5ML PO SOLN: 5.0000 mg | Freq: Once | ORAL | Status: AC | PRN

## 2013-10-19 MED ORDER — HYDROMORPHONE HCL PF 1 MG/ML IJ SOLN
0.2500 mg | INTRAMUSCULAR | Status: DC | PRN
  Administered 2013-10-19 (×2): 0.5 mg via INTRAVENOUS

## 2013-10-19 MED ORDER — HYDROMORPHONE HCL PF 1 MG/ML IJ SOLN
INTRAMUSCULAR | Status: AC
  Filled 2013-10-19: qty 1

## 2013-10-19 MED ORDER — ONDANSETRON HCL 4 MG/2ML IJ SOLN
INTRAMUSCULAR | Status: DC | PRN
  Administered 2013-10-19: 4 mg via INTRAVENOUS

## 2013-10-19 MED ORDER — MIDAZOLAM HCL 5 MG/5ML IJ SOLN
INTRAMUSCULAR | Status: DC | PRN
  Administered 2013-10-19: 2 mg via INTRAVENOUS

## 2013-10-19 MED ORDER — SODIUM CHLORIDE 0.9 % IJ SOLN: 3.0000 mL | INTRAMUSCULAR | Status: DC | PRN

## 2013-10-19 MED ORDER — FENTANYL CITRATE 0.05 MG/ML IJ SOLN
INTRAMUSCULAR | Status: DC | PRN
  Administered 2013-10-19: 100 ug via INTRAVENOUS

## 2013-10-19 MED ORDER — GLYCOPYRROLATE 0.2 MG/ML IJ SOLN
INTRAMUSCULAR | Status: DC | PRN
  Administered 2013-10-19: .7 mg via INTRAVENOUS

## 2013-10-19 MED ORDER — ACETAMINOPHEN 650 MG RE SUPP: 650.0000 mg | RECTAL | Status: DC | PRN

## 2013-10-19 MED ORDER — SODIUM CHLORIDE 0.9 % IJ SOLN: 3.0000 mL | Freq: Two times a day (BID) | INTRAMUSCULAR | Status: DC

## 2013-10-19 MED ORDER — SODIUM CHLORIDE 0.9 % IV SOLN: INTRAVENOUS | Status: DC

## 2013-10-19 MED ORDER — PROPOFOL 10 MG/ML IV BOLUS
INTRAVENOUS | Status: DC | PRN
  Administered 2013-10-19: 200 mg via INTRAVENOUS

## 2013-10-19 MED ORDER — OXYCODONE HCL 5 MG PO TABS
ORAL_TABLET | ORAL | Status: AC
  Filled 2013-10-19: qty 1

## 2013-10-19 MED ORDER — MIDAZOLAM HCL 2 MG/2ML IJ SOLN: 1.0000 mg | INTRAMUSCULAR | Status: DC | PRN

## 2013-10-19 MED ORDER — LIDOCAINE HCL (CARDIAC) 20 MG/ML IV SOLN
INTRAVENOUS | Status: DC | PRN
  Administered 2013-10-19: 100 mg via INTRAVENOUS

## 2013-10-19 MED ORDER — EPHEDRINE SULFATE 50 MG/ML IJ SOLN
INTRAMUSCULAR | Status: DC | PRN
  Administered 2013-10-19 (×3): 15 mg via INTRAVENOUS

## 2013-10-19 MED ORDER — OXYCODONE HCL 5 MG PO TABS: 5.0000 mg | ORAL_TABLET | ORAL | Status: DC | PRN

## 2013-10-19 MED ORDER — CEFAZOLIN SODIUM-DEXTROSE 2-3 GM-% IV SOLR
2.0000 g | INTRAVENOUS | Status: AC
  Administered 2013-10-19: 2 g via INTRAVENOUS

## 2013-10-19 MED ORDER — GLYCOPYRROLATE 0.2 MG/ML IJ SOLN: INTRAMUSCULAR | Status: DC | PRN

## 2013-10-19 MED ORDER — SODIUM CHLORIDE 0.9 % IV SOLN: 250.0000 mL | INTRAVENOUS | Status: DC | PRN

## 2013-10-19 MED ORDER — PROMETHAZINE HCL 25 MG/ML IJ SOLN: 6.2500 mg | INTRAMUSCULAR | Status: DC | PRN

## 2013-10-19 MED ORDER — LACTATED RINGERS IV SOLN
INTRAVENOUS | Status: DC
  Administered 2013-10-19: 10:00:00 via INTRAVENOUS

## 2013-10-19 MED ORDER — OXYCODONE HCL 5 MG PO TABS
5.0000 mg | ORAL_TABLET | Freq: Once | ORAL | Status: AC | PRN
  Administered 2013-10-19: 5 mg via ORAL

## 2013-10-19 MED ORDER — NEOSTIGMINE METHYLSULFATE 1 MG/ML IJ SOLN
INTRAMUSCULAR | Status: DC | PRN
  Administered 2013-10-19: 4 mg via INTRAVENOUS

## 2013-10-19 SURGICAL SUPPLY — 60 items
ADH SKN CLS APL DERMABOND .7 (GAUZE/BANDAGES/DRESSINGS) ×1
APL SKNCLS STERI-STRIP NONHPOA (GAUZE/BANDAGES/DRESSINGS)
APPLICATOR COTTON TIP 6IN STRL (MISCELLANEOUS) ×1 IMPLANT
BENZOIN TINCTURE PRP APPL 2/3 (GAUZE/BANDAGES/DRESSINGS) IMPLANT
BINDER ABDOMINAL 10 UNV 27-48 (MISCELLANEOUS) ×1 IMPLANT
BLADE HEX COATED 2.75 (ELECTRODE) ×3 IMPLANT
BLADE SURG 10 STRL SS (BLADE) ×1 IMPLANT
BLADE SURG 15 STRL LF DISP TIS (BLADE) ×1 IMPLANT
BLADE SURG 15 STRL SS (BLADE) ×2
BLADE SURG ROTATE 9660 (MISCELLANEOUS) ×1 IMPLANT
CANISTER SUCT 1200ML W/VALVE (MISCELLANEOUS) IMPLANT
CHLORAPREP W/TINT 26ML (MISCELLANEOUS) ×2 IMPLANT
COVER MAYO STAND STRL (DRAPES) ×2 IMPLANT
COVER TABLE BACK 60X90 (DRAPES) ×2 IMPLANT
DECANTER SPIKE VIAL GLASS SM (MISCELLANEOUS) ×1 IMPLANT
DERMABOND ADVANCED (GAUZE/BANDAGES/DRESSINGS) ×1
DERMABOND ADVANCED .7 DNX12 (GAUZE/BANDAGES/DRESSINGS) IMPLANT
DRAPE PED LAPAROTOMY (DRAPES) ×2 IMPLANT
DRAPE UTILITY XL STRL (DRAPES) ×2 IMPLANT
ELECT REM PT RETURN 9FT ADLT (ELECTROSURGICAL) ×2
ELECTRODE REM PT RTRN 9FT ADLT (ELECTROSURGICAL) ×1 IMPLANT
GLOVE BIOGEL PI IND STRL 7.0 (GLOVE) IMPLANT
GLOVE BIOGEL PI INDICATOR 7.0 (GLOVE) ×1
GLOVE ECLIPSE 6.5 STRL STRAW (GLOVE) ×1 IMPLANT
GLOVE EUDERMIC 7 POWDERFREE (GLOVE) ×2 IMPLANT
GOWN STRL REUS W/ TWL LRG LVL3 (GOWN DISPOSABLE) ×1 IMPLANT
GOWN STRL REUS W/ TWL XL LVL3 (GOWN DISPOSABLE) ×1 IMPLANT
GOWN STRL REUS W/TWL LRG LVL3 (GOWN DISPOSABLE) ×2
GOWN STRL REUS W/TWL XL LVL3 (GOWN DISPOSABLE) ×2
MESH VENTRALEX ST 8CM LRG (Mesh General) ×1 IMPLANT
NDL HYPO 25X1 1.5 SAFETY (NEEDLE) ×1 IMPLANT
NEEDLE HYPO 25X1 1.5 SAFETY (NEEDLE) ×2 IMPLANT
PACK BASIN DAY SURGERY FS (CUSTOM PROCEDURE TRAY) ×2 IMPLANT
PENCIL BUTTON HOLSTER BLD 10FT (ELECTRODE) ×3 IMPLANT
SLEEVE SCD COMPRESS KNEE MED (MISCELLANEOUS) ×1 IMPLANT
SPONGE GAUZE 4X4 12PLY STER LF (GAUZE/BANDAGES/DRESSINGS) ×1 IMPLANT
SPONGE LAP 18X18 X RAY DECT (DISPOSABLE) ×2 IMPLANT
SPONGE LAP 4X18 X RAY DECT (DISPOSABLE) ×1 IMPLANT
STAPLER VISISTAT 35W (STAPLE) IMPLANT
STRIP CLOSURE SKIN 1/2X4 (GAUZE/BANDAGES/DRESSINGS) IMPLANT
STRIP CLOSURE SKIN 1/4X4 (GAUZE/BANDAGES/DRESSINGS) IMPLANT
SUT MNCRL AB 4-0 PS2 18 (SUTURE) ×1 IMPLANT
SUT NOVA 0 T19/GS 22DT (SUTURE) IMPLANT
SUT NOVA NAB DX-16 0-1 5-0 T12 (SUTURE) ×2 IMPLANT
SUT PDS AB 0 CT 36 (SUTURE) IMPLANT
SUT PROLENE 0 CT 1 CR/8 (SUTURE) IMPLANT
SUT PROLENE 0 CT 2 (SUTURE) IMPLANT
SUT VIC AB 2-0 CT1 27 (SUTURE)
SUT VIC AB 2-0 CT1 TAPERPNT 27 (SUTURE) IMPLANT
SUT VICRYL 3-0 CR8 SH (SUTURE) ×1 IMPLANT
SUT VICRYL 4-0 PS2 18IN ABS (SUTURE) IMPLANT
SUT VICRYL AB 2 0 TIE (SUTURE) IMPLANT
SUT VICRYL AB 2 0 TIES (SUTURE)
SYR BULB 3OZ (MISCELLANEOUS) IMPLANT
SYR CONTROL 10ML LL (SYRINGE) ×2 IMPLANT
TOWEL OR 17X24 6PK STRL BLUE (TOWEL DISPOSABLE) ×3 IMPLANT
TOWEL OR NON WOVEN STRL DISP B (DISPOSABLE) ×1 IMPLANT
TRAY DSU PREP LF (CUSTOM PROCEDURE TRAY) ×1 IMPLANT
TUBE CONNECTING 20X1/4 (TUBING) IMPLANT
YANKAUER SUCT BULB TIP NO VENT (SUCTIONS) IMPLANT

## 2013-10-19 NOTE — Op Note (Signed)
Patient Name:           Peter Kent   Date of Surgery:        10/19/2013  Pre op Diagnosis:      Umbilical hernia  Post op Diagnosis:    Incarcerated Umbilical hernia  Procedure:                 Repair of umbilical hernia with mesh  Surgeon:                     Angelia Mould. Derrell Lolling, M.D., FACS  Assistant:                      None  Operative Indications:   Peter Kent is a 58 y.o. male. He is referred by Gabriel Earing, emergency department physician, for evaluation of symptomatic umbilical hernia. Dr. Oliver Barre is his PCP.  He gives a six-month history of a bulge at his umbilicus with minimal pain. He went to the emergency department on 09/03/2013 because the hernia got hard and he vomited once. He states that they easily pushed it back in and he has felt reasonably well since that time. He has no prior history of any hernia.  Comorbidities include history of head trauma, hypertension, obesity, and hyperlipidemia. He takes aspirin, labetalol, lovastatin, and tribenzor.    Operative Findings:       This was a large hernia, over 3 cm in diameter. There was a loop of small bowel that was chronically adherent to the upper rim of this that required careful sharp dissection to remove. The hernia appeared to have been present for many years. This was repaired with an 8 cm diameter ventralex ST bilayer composite patch.  Procedure in Detail:          Following induction of general endotracheal anesthesia the patient's abdomen was prepped and draped in a sterile fashion. Surgical time out was performed. Intravenous antibiotics were given. 0.5% Marcaine with epinephrine was used as a local infiltration anesthetic. A transverse curvilinear incision was made at the lower rim of the umbilicus. This incision was probably 7 or 8 cm in length because of the size of the hernia. Dissection was carried down through subcutaneous  tissue to the fascia at the base of the umbilical hernia. The large chronic hernia  sac was dissected away from the surrounding tissues and away from the umbilical skin. The hernia sac was carefully entered. The hernia sac was carefully debrided. I found a loop of small bowel adherent and incarcerated in the upper rim. I was able to insert my finger into the abdominal cavity and all the way around this loop of small bowel and  slowly dissected it  away from the rim of the fascia. I was able to pull the loop of small bowel up, inspect it  and there was no evidence of injury. This was simply dropped back in. I then completely debrided all the hernia sac circumferentially. I felt all around and did not feel any other defects. I used  an 8 cm ventralex-ST hernia patch, circular in nature. I cut off the tails of the mesh. This was sutured in place with 4 mattress sutures of #1 Novafil. These were placed at 12:00, 3:00, 6 clock, 9:00 positions. Great care was taken to place the  the smooth surface down toward the bowel and the rough surface up toward the undersurface of the muscle.   These were mattress sutures  that went down to the fascia, through the edge of the mesh, and back up through the fascia. After I placed all of these sutures I then folded the mesh and inserted it into the peritoneal space and lift up on the sutures and found that the mesh deployed nicely and without any redundancy. There was no bowel or omentum trapped within the mesh. All 4 sutures were tied.  The wound was irrigated. There was no bleeding. The fascia was closed transversely with numerous interrupted sutures of #1 Novafil. The umbilical skin was tacked back down to the fascia with a 3-0 Vicryl suture. Subcutaneous tissue was closed with 0 Vicryl sutures and the skin closed with a running subcuticular suture of 4-0 Monocryl and Dermabond. Abdominal binder was placed and the patient taken to PACU in stable condition. EBL 15 cc. Counts correct. Complications none.     Angelia MouldHaywood M. Derrell LollingIngram, M.D., FACS General and Minimally  Invasive Surgery Breast and Colorectal Surgery  10/19/2013 12:48 PM

## 2013-10-19 NOTE — Anesthesia Preprocedure Evaluation (Signed)
Anesthesia Evaluation  Patient identified by MRN, date of birth, ID band Patient awake    Reviewed: H&P , NPO status , Patient's Chart, lab work & pertinent test results  History of Anesthesia Complications Negative for: history of anesthetic complications  Airway Mallampati: I  Neck ROM: Full    Dental  (+) Edentulous Upper   Pulmonary neg pulmonary ROS,  breath sounds clear to auscultation        Cardiovascular hypertension, Rhythm:Regular Rate:Normal     Neuro/Psych    GI/Hepatic GERD-  ,  Endo/Other    Renal/GU      Musculoskeletal   Abdominal   Peds  Hematology   Anesthesia Other Findings   Reproductive/Obstetrics                           Anesthesia Physical Anesthesia Plan  ASA: II  Anesthesia Plan: General   Post-op Pain Management:    Induction: Intravenous  Airway Management Planned: LMA and Oral ETT  Additional Equipment:   Intra-op Plan:   Post-operative Plan: Extubation in OR  Informed Consent: I have reviewed the patients History and Physical, chart, labs and discussed the procedure including the risks, benefits and alternatives for the proposed anesthesia with the patient or authorized representative who has indicated his/her understanding and acceptance.   Dental advisory given  Plan Discussed with: CRNA and Surgeon  Anesthesia Plan Comments:         Anesthesia Quick Evaluation

## 2013-10-19 NOTE — Interval H&P Note (Signed)
History and Physical Interval Note:  10/19/2013 11:16 AM  Peter HugerFrank L Weller  has presented today for surgery, with the diagnosis of umbilical hernia  The goals and the  various methods of treatment have been discussed with the patient and family. After consideration of risks, benefits and other options for treatment, the patient has consented to  Procedure(s): UMBILICAL HERNIA REPAIR WITH MESH (N/A) INSERTION OF MESH (N/A) as a surgical intervention .  The patient's history has been reviewed, patient examined today, no change in status, stable for surgery.  I have reviewed the patient's chart and labs.  Questions were answered to the patient's satisfaction.     [name],[name]

## 2013-10-19 NOTE — Discharge Instructions (Signed)
[facility] _______[facility] ° °UMBILICAL OR INGUINAL HERNIA REPAIR: POST OP INSTRUCTIONS ° °Always review your discharge instruction sheet given to you by the facility where your surgery was performed. °IF YOU HAVE DISABILITY OR FAMILY LEAVE FORMS, YOU MUST BRING THEM TO THE OFFICE FOR PROCESSING.   °DO NOT GIVE THEM TO YOUR DOCTOR. ° °1. A  prescription for pain medication may be given to you upon discharge.  Take your pain medication as prescribed, if needed.  If narcotic pain medicine is not needed, then you may take acetaminophen (Tylenol) or ibuprofen (Advil) as needed. °2. Take your usually prescribed medications unless otherwise directed. °3. If you need a refill on your pain medication, please contact your pharmacy.  They will contact our office to request authorization. Prescriptions will not be filled after 5 pm or on week-ends. °4. You should follow a light diet the first 24 hours after arrival home, such as soup and crackers, etc.  Be sure to include lots of fluids daily.  Resume your normal diet the day after surgery. °5. Most patients will experience some swelling and bruising around the umbilicus or in the groin and scrotum.  Ice packs and reclining will help.  Swelling and bruising can take several days to resolve.  °6. It is common to experience some constipation if taking pain medication after surgery.  Increasing fluid intake and taking a stool softener (such as Colace) will usually help or prevent this problem from occurring.  A mild laxative (Milk of Magnesia or Miralax) should be taken according to package directions if there are no bowel movements after 48 hours. °7. Unless discharge instructions indicate otherwise, you may remove your bandages 24-48 hours after surgery, and you may shower at that time.  You may have steri-strips (small skin tapes) in place directly over the incision.  These strips should be left on the skin for 7-10 days.  If your surgeon used skin glue on the  incision, you may shower in 24 hours.  The glue will flake off over the next 2-3 weeks.  Any sutures or staples will be removed at the office during your follow-up visit. °8. ACTIVITIES:  You may resume regular (light) daily activities beginning the next day--such as daily self-care, walking, climbing stairs--gradually increasing activities as tolerated.  You may have sexual intercourse when it is comfortable.  Refrain from any heavy lifting or straining until approved by your doctor. °a. You may drive when you are no longer taking prescription pain medication, you can comfortably wear a seatbelt, and you can safely maneuver your car and apply brakes. °b. RETURN TO WORK:  __________________________________________________________ °9. You should see your doctor in the office for a follow-up appointment approximately 2-3 weeks after your surgery.  Make sure that you call for this appointment within a day or two after you arrive home to insure a convenient appointment time. °10. OTHER INSTRUCTIONS:  __________________________________________________________________________________________________________________________________________________________________________________________  °WHEN TO CALL YOUR DOCTOR: °1. Fever over 101.0 °2. Inability to urinate °3. Nausea and/or vomiting °4. Extreme swelling or bruising °5. Continued bleeding from incision. °6. Increased pain, redness, or drainage from the incision ° °The clinic staff is available to answer your questions during regular business hours.  Please don’t hesitate to call and ask to speak to one of the nurses for clinical concerns.  If you have a medical emergency, go to the nearest emergency room or call 911.  A surgeon from [facility] is always on call at the hospital ° ° °  [address], [address], [place], [place]  [address] ? ° [address] [address], [place], [place]   [address] °[phone] ? [phone] ? FAX [phone] °Web site:  [email or web address] ° ° ° °Post Anesthesia Home Care Instructions ° °Activity: °Get plenty of rest for the remainder of the day. A responsible adult should stay with you for 24 hours following the procedure.  °For the next 24 hours, DO NOT: °-Drive a car °-Operate machinery °-Drink alcoholic beverages °-Take any medication unless instructed by your physician °-Make any legal decisions or sign important papers. ° °Meals: °Start with liquid foods such as gelatin or soup. Progress to regular foods as tolerated. Avoid greasy, spicy, heavy foods. If nausea and/or vomiting occur, drink only clear liquids until the nausea and/or vomiting subsides. Call your physician if vomiting continues. ° °Special Instructions/Symptoms: °Your throat may feel dry or sore from the anesthesia or the breathing tube placed in your throat during surgery. If this causes discomfort, gargle with warm salt water. The discomfort should disappear within 24 hours. ° °

## 2013-10-19 NOTE — Anesthesia Procedure Notes (Signed)
Procedure Name: Intubation Performed by: York GricePEARSON, Sanvika Cuttino W Pre-anesthesia Checklist: Patient identified, Timeout performed, Emergency Drugs available, Suction available and Patient being monitored Patient Re-evaluated:Patient Re-evaluated prior to inductionOxygen Delivery Method: Circle system utilized Intubation Type: IV induction Ventilation: Mask ventilation without difficulty Laryngoscope Size: Miller and 2 Grade View: Grade I Tube type: Oral Tube size: 8.0 mm Number of attempts: 1 Airway Equipment and Method: Stylet Secured at: 23 cm Tube secured with: Tape Dental Injury: Teeth and Oropharynx as per pre-operative assessment

## 2013-10-19 NOTE — Transfer of Care (Signed)
Immediate Anesthesia Transfer of Care Note  Patient: Peter Kent  Procedure(s) Performed: Procedure(s): UMBILICAL HERNIA REPAIR WITH MESH (N/A) INSERTION OF MESH (N/A)  Patient Location: PACU  Anesthesia Type:General  Level of Consciousness: awake and sedated  Airway & Oxygen Therapy: Patient Spontanous Breathing and Patient connected to face mask oxygen  Post-op Assessment: Report given to PACU RN and Post -op Vital signs reviewed and stable  Post vital signs: Reviewed and stable  Complications: No apparent anesthesia complications

## 2013-10-19 NOTE — Anesthesia Postprocedure Evaluation (Signed)
  Anesthesia Post-op Note  Patient: Cherre HugerFrank L Kanner  Procedure(s) Performed: Procedure(s): UMBILICAL HERNIA REPAIR WITH MESH (N/A) INSERTION OF MESH (N/A)  Patient Location: PACU  Anesthesia Type:General  Level of Consciousness: awake and alert   Airway and Oxygen Therapy: Patient Spontanous Breathing  Post-op Pain: mild  Post-op Assessment: Post-op Vital signs reviewed  Post-op Vital Signs: stable  Complications: No apparent anesthesia complications

## 2013-10-21 ENCOUNTER — Encounter (HOSPITAL_BASED_OUTPATIENT_CLINIC_OR_DEPARTMENT_OTHER): Payer: Self-pay | Admitting: General Surgery

## 2013-10-24 ENCOUNTER — Other Ambulatory Visit: Payer: Self-pay | Admitting: *Deleted

## 2013-10-24 MED ORDER — OLMESARTAN-AMLODIPINE-HCTZ 40-10-25 MG PO TABS: 1.0000 | ORAL_TABLET | Freq: Every day | ORAL | Status: DC

## 2013-11-02 ENCOUNTER — Encounter (INDEPENDENT_AMBULATORY_CARE_PROVIDER_SITE_OTHER): Payer: Self-pay

## 2013-11-02 ENCOUNTER — Encounter (INDEPENDENT_AMBULATORY_CARE_PROVIDER_SITE_OTHER): Payer: Self-pay | Admitting: General Surgery

## 2013-11-02 ENCOUNTER — Ambulatory Visit (INDEPENDENT_AMBULATORY_CARE_PROVIDER_SITE_OTHER): Payer: 59 | Admitting: General Surgery

## 2013-11-02 VITALS — BP 142/88 | HR 78 | Temp 98.0°F | Resp 16 | Ht 74.0 in | Wt 251.8 lb

## 2013-11-02 DIAGNOSIS — K429 Umbilical hernia without obstruction or gangrene: Secondary | ICD-10-CM

## 2013-11-02 NOTE — Progress Notes (Signed)
Patient ID: Peter Kent, male   DOB: 1955-12-14, 58 y.o.   MRN: [id number] History: This man underwent open repair of a large incarcerated umbilical hernia with an 8 cm circular  mesh on 10/19/2013. He has done well. Minimal pain. No wound problems.  Exam: Patient looks well. Abdomen soft and nontender. Examined standing hernia repair is intact. The wound is healing with normal amount of thickening. No signs of infection or recurrence. No fluid collections  Assessment: Incarcerated umbilical hernia, recovering uneventfully following repair with 8 cm ventrlite ST composite patch  Plan: Diet and activities discussed Return to work, light-duty April 6 Return to work, full duty April 22 Return to see me as needed   General DynamicsHaywood M. Derrell LollingIngram, M.D., [facility] General and Minimally invasive Surgery Breast and Colorectal Surgery Office:   [phone] Pager:   [phone]

## 2013-11-02 NOTE — Patient Instructions (Signed)
You are recovering from your umbilical hernia repair with mesh without any obvious surgical complications.  Be sure to walk a mile or 2 every day  No sports or heavy lifting until April 22.  You  may return to work, light-duty, on April 6  You may return to work,  full duty without restrictions, April 22  Return to see Dr. Derrell LollingIngram if necessary

## 2013-11-04 ENCOUNTER — Telehealth (INDEPENDENT_AMBULATORY_CARE_PROVIDER_SITE_OTHER): Payer: Self-pay

## 2013-11-04 NOTE — Telephone Encounter (Signed)
Patient work is asking for clarification of Light Duty return to work note For 11-14-13, Please send to Florentina AddisonKatie of light duty description for patient Fax # [phone]

## 2013-11-06 NOTE — Telephone Encounter (Signed)
Light duty:   No lifting more than 25 lbs.  No pushing more than 35 lbs. No climbing.  No running or aerobic exercise or sports. Unlimited walking and unlimited driving.  hmi

## 2013-11-07 ENCOUNTER — Encounter (INDEPENDENT_AMBULATORY_CARE_PROVIDER_SITE_OTHER): Payer: Self-pay

## 2013-11-07 NOTE — Telephone Encounter (Signed)
Work letter with attached restrictions from Dr Derrell LollingIngram faxed to attached #.

## 2013-12-12 ENCOUNTER — Other Ambulatory Visit: Payer: Self-pay | Admitting: Internal Medicine

## 2014-01-18 ENCOUNTER — Telehealth: Payer: Self-pay | Admitting: Internal Medicine

## 2014-01-18 MED ORDER — OLMESARTAN-AMLODIPINE-HCTZ 40-10-25 MG PO TABS: 1.0000 | ORAL_TABLET | Freq: Every day | ORAL | Status: DC

## 2014-01-18 NOTE — Telephone Encounter (Signed)
Refill request has been sent in to [facility].  Called the patient left a detailed message prescription requested has been sent in to the pharmacy

## 2014-01-18 NOTE — Telephone Encounter (Signed)
Patient called to request refill of Tribenzor.

## 2014-08-16 ENCOUNTER — Ambulatory Visit (INDEPENDENT_AMBULATORY_CARE_PROVIDER_SITE_OTHER): Payer: 59 | Admitting: Internal Medicine

## 2014-08-16 ENCOUNTER — Encounter: Payer: Self-pay | Admitting: Internal Medicine

## 2014-08-16 ENCOUNTER — Other Ambulatory Visit: Payer: Self-pay | Admitting: Internal Medicine

## 2014-08-16 ENCOUNTER — Other Ambulatory Visit (INDEPENDENT_AMBULATORY_CARE_PROVIDER_SITE_OTHER): Payer: 59

## 2014-08-16 VITALS — BP 130/82 | HR 82 | Temp 98.0°F | Ht 74.0 in | Wt 270.0 lb

## 2014-08-16 DIAGNOSIS — M545 Low back pain, unspecified: Secondary | ICD-10-CM

## 2014-08-16 DIAGNOSIS — R7302 Impaired glucose tolerance (oral): Secondary | ICD-10-CM

## 2014-08-16 DIAGNOSIS — Z0000 Encounter for general adult medical examination without abnormal findings: Secondary | ICD-10-CM

## 2014-08-16 DIAGNOSIS — Z23 Encounter for immunization: Secondary | ICD-10-CM

## 2014-08-16 LAB — TSH: TSH: 1.05 u[IU]/mL (ref 0.35–4.50)

## 2014-08-16 LAB — CBC WITH DIFFERENTIAL/PLATELET
BASOS ABS: 0 10*3/uL (ref 0.0–0.1)
Basophils Relative: 0.5 % (ref 0.0–3.0)
EOS PCT: 0.4 % (ref 0.0–5.0)
Eosinophils Absolute: 0 10*3/uL (ref 0.0–0.7)
HEMATOCRIT: 42.7 % (ref 39.0–52.0)
Hemoglobin: 14 g/dL (ref 13.0–17.0)
LYMPHS ABS: 1.6 10*3/uL (ref 0.7–4.0)
Lymphocytes Relative: 24.8 % (ref 12.0–46.0)
MCHC: 32.7 g/dL (ref 30.0–36.0)
MCV: 88.7 fl (ref 78.0–100.0)
Monocytes Absolute: 0.5 10*3/uL (ref 0.1–1.0)
Monocytes Relative: 7.1 % (ref 3.0–12.0)
Neutro Abs: 4.4 10*3/uL (ref 1.4–7.7)
Neutrophils Relative %: 67.2 % (ref 43.0–77.0)
PLATELETS: 251 10*3/uL (ref 150.0–400.0)
RBC: 4.81 Mil/uL (ref 4.22–5.81)
RDW: 14.4 % (ref 11.5–15.5)
WBC: 6.5 10*3/uL (ref 4.0–10.5)

## 2014-08-16 LAB — URINALYSIS, ROUTINE W REFLEX MICROSCOPIC
BILIRUBIN URINE: NEGATIVE
Ketones, ur: NEGATIVE
LEUKOCYTES UA: NEGATIVE
Nitrite: NEGATIVE
PH: 5.5 (ref 5.0–8.0)
Specific Gravity, Urine: >=1.03 — AB (ref 1.000–1.030)
Total Protein, Urine: NEGATIVE
UROBILINOGEN UA: 0.2 (ref 0.0–1.0)
Urine Glucose: NEGATIVE

## 2014-08-16 LAB — HEPATIC FUNCTION PANEL
ALT: 22 U/L (ref 0–53)
AST: 22 U/L (ref 0–37)
Albumin: 4 g/dL (ref 3.5–5.2)
Alkaline Phosphatase: 73 U/L (ref 39–117)
Bilirubin, Direct: 0.1 mg/dL (ref 0.0–0.3)
TOTAL PROTEIN: 7.1 g/dL (ref 6.0–8.3)
Total Bilirubin: 0.8 mg/dL (ref 0.2–1.2)

## 2014-08-16 LAB — BASIC METABOLIC PANEL
BUN: 17 mg/dL (ref 6–23)
CHLORIDE: 104 meq/L (ref 96–112)
CO2: 26 mEq/L (ref 19–32)
CREATININE: 1.2 mg/dL (ref 0.4–1.5)
Calcium: 9.1 mg/dL (ref 8.4–10.5)
GFR: 80.64 mL/min (ref >60.00)
Glucose, Bld: 148 mg/dL — ABNORMAL HIGH (ref 70–99)
POTASSIUM: 4.1 meq/L (ref 3.5–5.1)
Sodium: 136 mEq/L (ref 135–145)

## 2014-08-16 LAB — LIPID PANEL
Cholesterol: 213 mg/dL — ABNORMAL HIGH (ref 0–200)
HDL: 48.5 mg/dL (ref >39.00)
LDL CALC: 130 mg/dL — AB (ref 0–99)
NONHDL: 164.5
TRIGLYCERIDES: 173 mg/dL — AB (ref 0.0–149.0)
Total CHOL/HDL Ratio: 4
VLDL: 34.6 mg/dL (ref 0.0–40.0)

## 2014-08-16 LAB — HEMOGLOBIN A1C: HEMOGLOBIN A1C: 6.3 % (ref 4.6–6.5)

## 2014-08-16 LAB — PSA: PSA: 0.59 ng/mL (ref 0.10–4.00)

## 2014-08-16 MED ORDER — LOVASTATIN 40 MG PO TABS: ORAL_TABLET | ORAL | Status: DC

## 2014-08-16 MED ORDER — TIZANIDINE HCL 4 MG PO TABS: 4.0000 mg | ORAL_TABLET | Freq: Four times a day (QID) | ORAL | Status: DC | PRN

## 2014-08-16 NOTE — Progress Notes (Signed)
Subjective:    Patient ID: Peter HugerFrank L Lepkowski, male    DOB: 03-06-1956, 59 y.o.   MRN: [id number]  HPI  Here for wellness and f/u;  Overall doing ok;  Pt denies CP, worsening SOB, DOE, wheezing, orthopnea, PND, worsening LE edema, palpitations, dizziness or syncope.  Pt denies neurological change such as new headache, facial or extremity weakness.  Pt denies polydipsia, polyuria, or low sugar symptoms. Pt states overall good compliance with treatment and medications, good tolerability, and has been trying to follow lower cholesterol diet.  Pt denies worsening depressive symptoms, suicidal ideation or panic. No fever, night sweats, wt loss, loss of appetite, or other constitutional symptoms.  Pt states good ability with ADL's, has low fall risk, home safety reviewed and adequate, no other significant changes in hearing or vision, and only occasionally active with exercise.  Pt states also acute onset left LBP now minor and improved, with no bowel or bladder change, fever, wt loss,  worsening LE pain/numbness/weakness, gait change or falls.  Started after simply standing up from a chair to walk  Needs note to be cleared to go back to work without restriction Past Medical History  Diagnosis Date  . ERECTILE DYSFUNCTION 04/08/2007  . HEAD TRAUMA, CLOSED 04/08/2007  . HYPERCHOLESTEROLEMIA 04/08/2007  . HYPERLIPIDEMIA 04/11/2007  . HYPERTENSION 04/08/2007  . Morbid obesity 04/08/2007  . Preventative health care 03/22/2011  . Impaired glucose tolerance 07/05/2013  . GERD (gastroesophageal reflux disease)   . Wears glasses   . Full dentures    Past Surgical History  Procedure Laterality Date  . Multiple tooth extractions    . Circumcision    . Umbilical hernia repair N/A 10/19/2013    Procedure: UMBILICAL HERNIA REPAIR WITH MESH;  Surgeon: Ernestene MentionHaywood M Ingram, MD;  Location: [facility];  Service: General;  Laterality: N/A;  . Insertion of mesh N/A 10/19/2013    Procedure: INSERTION OF MESH;   Surgeon: Ernestene MentionHaywood M Ingram, MD;  Location: [facility];  Service: General;  Laterality: N/A;    reports that he has never smoked. He has never used smokeless tobacco. He reports that he does not drink alcohol or use illicit drugs. family history includes Cancer in his other; Diabetes in his other; Heart disease in his other; Hypertension in his other. No Known Allergies Current Outpatient Prescriptions on File Prior to Visit  Medication Sig Dispense Refill  . aspirin 81 MG tablet Take 81 mg by mouth daily.      Marland Kitchen. labetalol (NORMODYNE) 200 MG tablet Take 400 mg by mouth 2 (two) times daily.    Marland Kitchen. lovastatin (MEVACOR) 20 MG tablet Take 40 mg by mouth at bedtime.    . lovastatin (MEVACOR) 20 MG tablet TAKE TWO TABLETS BY MOUTH ONCE DAILY 180 tablet 2  . Olmesartan-Amlodipine-HCTZ (TRIBENZOR) 40-10-25 MG TABS Take 1 tablet by mouth daily. 90 tablet 3  . omeprazole (PRILOSEC) 20 MG capsule Take 1 capsule (20 mg total) by mouth daily as needed. As needed 90 capsule 3  . ondansetron (ZOFRAN) 4 MG tablet Take 1 tablet (4 mg total) by mouth every 6 (six) hours. 6 tablet 0   No current facility-administered medications on file prior to visit.   Review of Systems Constitutional: Negative for increased diaphoresis, other activity, appetite or other siginficant weight change  HENT: Negative for worsening hearing loss, ear pain, facial swelling, mouth sores and neck stiffness.   Eyes: Negative for other worsening pain, redness or visual disturbance.  Respiratory:  Negative for shortness of breath and wheezing.   Cardiovascular: Negative for chest pain and palpitations.  Gastrointestinal: Negative for diarrhea, blood in stool, abdominal distention or other pain Genitourinary: Negative for hematuria, flank pain or change in urine volume.  Musculoskeletal: Negative for myalgias or other joint complaints.  Skin: Negative for color change and wound.  Neurological: Negative for syncope and  numbness. other than noted Hematological: Negative for adenopathy. or other swelling Psychiatric/Behavioral: Negative for hallucinations, self-injury, decreased concentration or other worsening agitation.      Objective:   Physical Exam BP 130/82 mmHg  Pulse 82  Temp(Src) 98 F (36.7 C) (Oral)  Ht  (1.88 m)  Wt 270 lb (122.471 kg)  BMI 34.65 kg/m2  SpO2 96% VS noted,  Constitutional: Pt is oriented to person, place, and time. Appears well-developed and well-nourished.  Head: Normocephalic and atraumatic.  Right Ear: External ear normal.  Left Ear: External ear normal.  Nose: Nose normal.  Mouth/Throat: Oropharynx is clear and moist.  Eyes: Conjunctivae and EOM are normal. Pupils are equal, round, and reactive to light.  Neck: Normal range of motion. Neck supple. No JVD present. No tracheal deviation present.  Cardiovascular: Normal rate, regular rhythm, normal heart sounds and intact distal pulses.   Pulmonary/Chest: Effort normal and breath sounds without rales or wheezing  Abdominal: Soft. Bowel sounds are normal. NT. No HSM  Musculoskeletal: Normal range of motion. Exhibits no edema.  Lymphadenopathy:  Has no cervical adenopathy.  Neurological: Pt is alert and oriented to person, place, and time. Pt has normal reflexes. No cranial nerve deficit. Motor grossly intact Skin: Skin is warm and dry. No rash noted.  Psychiatric:  Has normal mood and affect. Behavior is normal.  Spine nontender BP Readings from Last 3 Encounters:  08/16/14 130/82  11/02/13 142/88  10/19/13 138/87        Assessment & Plan:

## 2014-08-16 NOTE — Progress Notes (Signed)
Pre visit review using our clinic review tool, if applicable. No additional management support is needed unless otherwise documented below in the visit note. 

## 2014-08-16 NOTE — Assessment & Plan Note (Signed)
stable overall by history and exam, recent data reviewed with pt, and pt to continue medical treatment as before,  to f/u any worsening symptoms or concerns Lab Results  Component Value Date   HGBA1C 6.3 08/16/2014    

## 2014-08-16 NOTE — Patient Instructions (Addendum)
Please take all new medication as prescribed - the tizanidine muscle relaxer  Please continue all other medications as before, and refills have been done if requested.  Please have the pharmacy call with any other refills you may need.  Please continue your efforts at being more active, low cholesterol diet, and weight control.  You are otherwise up to date with prevention measures today.  Please keep your appointments with your specialists as you may have planned  You will be contacted regarding the referral for: colonoscopy  Please go to the LAB in the Basement (turn left off the elevator) for the tests to be done today  You will be contacted by phone if any changes need to be made immediately.  Otherwise, you will receive a letter about your results with an explanation, but please check with MyChart first.  Please remember to sign up for MyChart if you have not done so, as this will be important to you in the future with finding out test results, communicating by private email, and scheduling acute appointments online when needed.  Please return in 6 months, or sooner if needed

## 2014-08-16 NOTE — Assessment & Plan Note (Signed)
Improved, c/w strain,  to f/u any worsening symptoms or concerns

## 2014-08-16 NOTE — Assessment & Plan Note (Signed)

## 2014-08-17 ENCOUNTER — Telehealth: Payer: Self-pay | Admitting: Internal Medicine

## 2014-08-17 MED ORDER — LOVASTATIN 40 MG PO TABS: ORAL_TABLET | ORAL | Status: DC

## 2014-08-17 NOTE — Telephone Encounter (Signed)
Actually, we have now increased his lovastatin to total of 80 mg  OK to use up what he has, and should already have another rx there for 40 mg x 2 pills per day (there is no 80 mg pill)  I will send again just in case

## 2014-08-17 NOTE — Telephone Encounter (Signed)
Patient states he just picked up lovastatin that is 20mg  from the pharmacy.  States Dr. Jonny RuizJohn upped the mg to 40. He is requesting to just take two of the 20mg  until he runs out. Please advise.

## 2014-08-17 NOTE — Telephone Encounter (Signed)
Patient informed. 

## 2014-09-13 ENCOUNTER — Encounter: Payer: Self-pay | Admitting: Internal Medicine

## 2014-09-18 ENCOUNTER — Encounter: Payer: Self-pay | Admitting: Gastroenterology

## 2014-11-07 ENCOUNTER — Ambulatory Visit (AMBULATORY_SURGERY_CENTER): Payer: Self-pay | Admitting: *Deleted

## 2014-11-07 VITALS — Ht 74.0 in | Wt 275.8 lb

## 2014-11-07 DIAGNOSIS — Z1211 Encounter for screening for malignant neoplasm of colon: Secondary | ICD-10-CM

## 2014-11-07 MED ORDER — NA SULFATE-K SULFATE-MG SULF 17.5-3.13-1.6 GM/177ML PO SOLN
1.0000 | Freq: Once | ORAL | Status: DC
Start: 2014-11-07 — End: 2014-11-21

## 2014-11-07 NOTE — Progress Notes (Signed)
No eggs or soy allergy No diet pills No home 02 use No issues with past sedation emmi declined

## 2014-11-21 ENCOUNTER — Ambulatory Visit (AMBULATORY_SURGERY_CENTER): Payer: 59 | Admitting: Gastroenterology

## 2014-11-21 ENCOUNTER — Encounter: Payer: Self-pay | Admitting: Gastroenterology

## 2014-11-21 VITALS — BP 159/103 | HR 70 | Temp 97.8°F | Resp 20 | Ht 74.0 in | Wt 275.0 lb

## 2014-11-21 DIAGNOSIS — Z1211 Encounter for screening for malignant neoplasm of colon: Secondary | ICD-10-CM | POA: Diagnosis not present

## 2014-11-21 MED ORDER — SODIUM CHLORIDE 0.9 % IV SOLN: 500.0000 mL | INTRAVENOUS | Status: DC

## 2014-11-21 NOTE — Progress Notes (Signed)
Marrion Coyebra Merritt CRNA notifed that admission BP 159/114 left arm  173/118 right arm .  Did not take meds this am.  Rechecked mannually Right 150/120, Left 140/110.  Report to Marrion Coyebra Merritt RN.  Pt. Denies symptoms.

## 2014-11-21 NOTE — Patient Instructions (Signed)
YOU HAD AN ENDOSCOPIC PROCEDURE TODAY AT THE [facility]:   Refer to the procedure report that was given to you for any specific questions about what was found during the examination.  If the procedure report does not answer your questions, please call your gastroenterologist to clarify.  If you requested that your care partner not be given the details of your procedure findings, then the procedure report has been included in a sealed envelope for you to review at your convenience later.  YOU SHOULD EXPECT: Some feelings of bloating in the abdomen. Passage of more gas than usual.  Walking can help get rid of the air that was put into your GI tract during the procedure and reduce the bloating. If you had a lower endoscopy (such as a colonoscopy or flexible sigmoidoscopy) you may notice spotting of blood in your stool or on the toilet paper. If you underwent a bowel prep for your procedure, you may not have a normal bowel movement for a few days.  Please Note:  You might notice some irritation and congestion in your nose or some drainage.  This is from the oxygen used during your procedure.  There is no need for concern and it should clear up in a day or so.  SYMPTOMS TO REPORT IMMEDIATELY:   Following lower endoscopy (colonoscopy or flexible sigmoidoscopy):  Excessive amounts of blood in the stool  Significant tenderness or worsening of abdominal pains  Swelling of the abdomen that is new, acute  Fever of 100F or higher   For urgent or emergent issues, a gastroenterologist can be reached at any hour by calling [phone].   DIET: Your first meal following the procedure should be a small meal and then it is ok to progress to your normal diet. Heavy or fried foods are harder to digest and may make you feel nauseous or bloated.  Likewise, meals heavy in dairy and vegetables can increase bloating.  Drink plenty of fluids but you should avoid alcoholic beverages for 24  hours.  ACTIVITY:  You should plan to take it easy for the rest of today and you should NOT DRIVE or use heavy machinery until tomorrow (because of the sedation medicines used during the test).    FOLLOW UP: Our staff will call the number listed on your records the next business day following your procedure to check on you and address any questions or concerns that you may have regarding the information given to you following your procedure. If we do not reach you, we will leave a message.  However, if you are feeling well and you are not experiencing any problems, there is no need to return our call.  We will assume that you have returned to your regular daily activities without incident.  If any biopsies were taken you will be contacted by phone or by letter within the next 1-3 weeks.  Please call us at [phone] if you have not heard about the biopsies in 3 weeks.    SIGNATURES/CONFIDENTIALITY: You and/or your care partner have signed paperwork which will be entered into your electronic medical record.  These signatures attest to the fact that that the information above on your After Visit Summary has been reviewed and is understood.  Full responsibility of the confidentiality of this discharge information lies with you and/or your care-partner.  Recommendations Discharge instructions given to patient and/or care partner. Next colonoscopy in 10 years. 

## 2014-11-21 NOTE — Op Note (Signed)
[facility] [address] [place], [id number]   COLONOSCOPY PROCEDURE REPORT  PATIENT: Peter Kent, Peter L  MR#: [id number] BIRTHDATE: 1955/09/18 , 58  yrs. old GENDER: male ENDOSCOPIST: Louis Meckelobert D Kaplan, MD REFERRED QI:ONGEXBY:James John, M.D. PROCEDURE DATE:  11/21/2014 PROCEDURE:   Colonoscopy, screening First Screening Colonoscopy - Avg.  risk and is 50 yrs.  old or older Yes.  Prior Negative Screening - Now for repeat screening. N/A  History of Adenoma - Now for follow-up colonoscopy & has been > or = to 3 yrs.  N/A ASA CLASS:   Class II INDICATIONS:Colorectal Neoplasm Risk Assessment for this procedure is average risk. MEDICATIONS: Monitored anesthesia care and Propofol 200 mg IV  DESCRIPTION OF PROCEDURE:   After the risks benefits and alternatives of the procedure were thoroughly explained, informed consent was obtained.  The digital rectal exam revealed no abnormalities of the rectum.   The LB [id number]  endoscope was introduced through the anus and advanced to the ileum. No adverse events experienced.   The quality of the prep was (Suprep was used) excellent.  The instrument was then slowly withdrawn as the colon was fully examined.      COLON FINDINGS: A normal appearing cecum, ileocecal valve, and appendiceal orifice were identified.  The ascending, transverse, descending, sigmoid colon, and rectum appeared unremarkable. Retroflexed views revealed no abnormalities. The time to cecum = 2.0 Withdrawal time = 6.7   The scope was withdrawn and the procedure completed. COMPLICATIONS: There were no immediate complications.  ENDOSCOPIC IMPRESSION: Normal colonoscopy  RECOMMENDATIONS: Continue current colorectal screening recommendations for "routine risk" patients with a repeat colonoscopy in 10 years.  eSigned:  Louis Meckelobert D Kaplan, MD 11/21/2014 9:37 AM   cc:

## 2014-11-22 ENCOUNTER — Telehealth: Payer: Self-pay | Admitting: *Deleted

## 2014-11-22 NOTE — Telephone Encounter (Signed)
  Follow up Call-  Call back number 11/21/2014  Post procedure Call Back phone  # [phone]  Permission to leave phone message No     Patient questions:  Do you have a fever, pain , or abdominal swelling? No. Pain Score  0 *  Have you tolerated food without any problems? Yes.    Have you been able to return to your normal activities? Yes.    Do you have any questions about your discharge instructions: Diet   No. Medications  No. Follow up visit  No.  Do you have questions or concerns about your Care? No.  Actions: * If pain score is 4 or above: No action needed, pain <4.

## 2015-02-20 ENCOUNTER — Ambulatory Visit: Payer: 59 | Admitting: Internal Medicine

## 2015-02-28 ENCOUNTER — Ambulatory Visit (INDEPENDENT_AMBULATORY_CARE_PROVIDER_SITE_OTHER): Payer: 59 | Admitting: Internal Medicine

## 2015-02-28 ENCOUNTER — Other Ambulatory Visit (INDEPENDENT_AMBULATORY_CARE_PROVIDER_SITE_OTHER): Payer: 59

## 2015-02-28 ENCOUNTER — Encounter: Payer: Self-pay | Admitting: Internal Medicine

## 2015-02-28 VITALS — BP 148/104 | HR 78 | Temp 98.1°F | Ht 74.0 in | Wt 275.0 lb

## 2015-02-28 DIAGNOSIS — R7302 Impaired glucose tolerance (oral): Secondary | ICD-10-CM

## 2015-02-28 DIAGNOSIS — R312 Other microscopic hematuria: Secondary | ICD-10-CM | POA: Diagnosis not present

## 2015-02-28 DIAGNOSIS — R3129 Other microscopic hematuria: Secondary | ICD-10-CM

## 2015-02-28 DIAGNOSIS — E785 Hyperlipidemia, unspecified: Secondary | ICD-10-CM | POA: Diagnosis not present

## 2015-02-28 DIAGNOSIS — I10 Essential (primary) hypertension: Secondary | ICD-10-CM

## 2015-02-28 LAB — URINALYSIS, ROUTINE W REFLEX MICROSCOPIC
Bilirubin Urine: NEGATIVE
Ketones, ur: NEGATIVE
Leukocytes, UA: NEGATIVE
Nitrite: NEGATIVE
Specific Gravity, Urine: 1.025 (ref 1.000–1.030)
Total Protein, Urine: NEGATIVE
Urine Glucose: NEGATIVE
Urobilinogen, UA: 0.2 (ref 0.0–1.0)
WBC UA: NONE SEEN (ref 0–?)
pH: 6 (ref 5.0–8.0)

## 2015-02-28 LAB — BASIC METABOLIC PANEL
BUN: 16 mg/dL (ref 6–23)
CALCIUM: 9.3 mg/dL (ref 8.4–10.5)
CHLORIDE: 101 meq/L (ref 96–112)
CO2: 30 mEq/L (ref 19–32)
CREATININE: 1.17 mg/dL (ref 0.40–1.50)
GFR: 82.08 mL/min (ref >60.00)
GLUCOSE: 113 mg/dL — AB (ref 70–99)
Potassium: 4.2 mEq/L (ref 3.5–5.1)
SODIUM: 137 meq/L (ref 135–145)

## 2015-02-28 LAB — LIPID PANEL
CHOL/HDL RATIO: 4
Cholesterol: 206 mg/dL — ABNORMAL HIGH (ref 0–200)
HDL: 48.6 mg/dL (ref >39.00)
LDL CALC: 136 mg/dL — AB (ref 0–99)
NonHDL: 157.4
TRIGLYCERIDES: 108 mg/dL (ref 0.0–149.0)
VLDL: 21.6 mg/dL (ref 0.0–40.0)

## 2015-02-28 LAB — HEPATIC FUNCTION PANEL
ALBUMIN: 4.1 g/dL (ref 3.5–5.2)
ALK PHOS: 76 U/L (ref 39–117)
ALT: 24 U/L (ref 0–53)
AST: 24 U/L (ref 0–37)
Bilirubin, Direct: 0.1 mg/dL (ref 0.0–0.3)
TOTAL PROTEIN: 7.2 g/dL (ref 6.0–8.3)
Total Bilirubin: 0.5 mg/dL (ref 0.2–1.2)

## 2015-02-28 LAB — HEMOGLOBIN A1C: Hgb A1c MFr Bld: 6 % (ref 4.6–6.5)

## 2015-02-28 MED ORDER — HYDRALAZINE HCL 25 MG PO TABS: 25.0000 mg | ORAL_TABLET | Freq: Three times a day (TID) | ORAL | Status: DC

## 2015-02-28 NOTE — Assessment & Plan Note (Signed)
stable overall by history and exam, recent data reviewed with pt, and pt to continue medical treatment as before,  to f/u any worsening symptoms or concerns Lab Results  Component Value Date   HGBA1C 6.3 08/16/2014

## 2015-02-28 NOTE — Progress Notes (Signed)
Subjective:    Patient ID: Peter Kent, male    DOB: [birth year], 59 y.o.   MRN: [id number]  HPI  Here to f/u; overall doing ok,  Pt denies chest pain, increasing sob or doe, wheezing, orthopnea, PND, increased LE swelling, palpitations, dizziness or syncope.  Pt denies new neurological symptoms such as new headache, or facial or extremity weakness or numbness.  Pt denies polydipsia, polyuria, or low sugar episode.   Pt denies new neurological symptoms such as new headache, or facial or extremity weakness or numbness.   Pt states overall good compliance with meds, mostly trying to follow appropriate diet, with wt overall stable,  but little exercise however.  Hasa gained significant wt increase from 245 to current 275 since nov 2014, with hernia repair, less exercise. BP's have been increased recently but at workplace has been < 140/90 per pt.  Denies urinary symptoms such as dysuria, frequency, urgency, flank pain, hematuria or n/v, fever, chills. Wt Readings from Last 3 Encounters:  02/28/15 275 lb (124.739 kg)  11/21/14 275 lb (124.739 kg)  11/07/14 275 lb 12.8 oz (125.102 kg)   Past Medical History  Diagnosis Date  . ERECTILE DYSFUNCTION 04/08/2007  . HEAD TRAUMA, CLOSED 04/08/2007  . HYPERCHOLESTEROLEMIA 04/08/2007  . HYPERLIPIDEMIA 04/11/2007  . HYPERTENSION 04/08/2007  . Morbid obesity 04/08/2007  . Preventative health care 03/22/2011  . Impaired glucose tolerance 07/05/2013  . GERD (gastroesophageal reflux disease)   . Wears glasses   . Full dentures    Past Surgical History  Procedure Laterality Date  . Multiple tooth extractions    . Circumcision    . Umbilical hernia repair N/A 10/19/2013    Procedure: UMBILICAL HERNIA REPAIR WITH MESH;  Surgeon: Ernestene MentionHaywood M Ingram, MD;  Location: [facility];  Service: General;  Laterality: N/A;  . Insertion of mesh N/A 10/19/2013    Procedure: INSERTION OF MESH;  Surgeon: Ernestene MentionHaywood M Ingram, MD;  Location: [facility];   Service: General;  Laterality: N/A;    reports that he has never smoked. He has never used smokeless tobacco. He reports that he does not drink alcohol or use illicit drugs. family history includes Cancer in his other; Diabetes in his other; Heart disease in his other; Hypertension in his other. There is no history of Colon cancer. No Known Allergies Current Outpatient Prescriptions on File Prior to Visit  Medication Sig Dispense Refill  . aspirin 81 MG tablet Take 81 mg by mouth daily.      Marland Kitchen. labetalol (NORMODYNE) 200 MG tablet Take 400 mg by mouth 2 (two) times daily.    Marland Kitchen. lovastatin (MEVACOR) 40 MG tablet 2 tabs by mouth daily 180 tablet 3  . Olmesartan-Amlodipine-HCTZ (TRIBENZOR) 40-10-25 MG TABS Take 1 tablet by mouth daily. 90 tablet 3  . tiZANidine (ZANAFLEX) 4 MG tablet Take 1 tablet (4 mg total) by mouth every 6 (six) hours as needed for muscle spasms. 30 tablet 0  . omeprazole (PRILOSEC) 20 MG capsule Take 1 capsule (20 mg total) by mouth daily as needed. As needed (Patient not taking: Reported on 02/28/2015) 90 capsule 3  . ondansetron (ZOFRAN) 4 MG tablet Take 1 tablet (4 mg total) by mouth every 6 (six) hours. (Patient not taking: Reported on 02/28/2015) 6 tablet 0   No current facility-administered medications on file prior to visit.   Review of Systems  Constitutional: Negative for unusual diaphoresis or night sweats HENT: Negative for ringing in ear or discharge Eyes: Negative  for double vision or worsening visual disturbance.  Respiratory: Negative for choking and stridor.   Gastrointestinal: Negative for vomiting or other signifcant bowel change Genitourinary: Negative for hematuria or change in urine volume.  Musculoskeletal: Negative for other MSK pain or swelling Skin: Negative for color change and worsening wound.  Neurological: Negative for tremors and numbness other than noted  Psychiatric/Behavioral: Negative for decreased concentration or agitation other than above        Objective:   Physical Exam BP 148/104 mmHg  Pulse 78  Temp(Src) 98.1 F (36.7 C) (Oral)  Ht  (1.88 m)  Wt 275 lb (124.739 kg)  BMI 35.29 kg/m2  SpO2 98% VS noted,  Constitutional: Pt appears in no significant distress HENT: Head: NCAT.  Right Ear: External ear normal.  Left Ear: External ear normal.  Eyes: . Pupils are equal, round, and reactive to light. Conjunctivae and EOM are normal Neck: Normal range of motion. Neck supple.  Cardiovascular: Normal rate and regular rhythm.   Pulmonary/Chest: Effort normal and breath sounds without rales or wheezing.  Abd:  Soft, NT, ND, + BS Neurological: Pt is alert. Not confused , motor grossly intact Skin: Skin is warm. No rash, no LE edema Psychiatric: Pt behavior is normal. No agitation.      Assessment & Plan:

## 2015-02-28 NOTE — Assessment & Plan Note (Signed)
D/w pt, [facility] urology for now, to re-check UA

## 2015-02-28 NOTE — Assessment & Plan Note (Signed)
Now on increased statin from last visit,. O/w  stable overall by history and exam, recent data reviewed with pt, and pt to continue medical treatment as before,  to f/u any worsening symptoms or concerns Lab Results  Component Value Date   LDLCALC 130* 08/16/2014   For f/u lab

## 2015-02-28 NOTE — Assessment & Plan Note (Signed)
Uncontrolled despite 4 meds in the face of increasing wt; for add hydralazine 25 tid, pt states will plan more exercise, wt loss, BP Readings from Last 3 Encounters:  02/28/15 148/104  11/21/14 159/103  08/16/14 130/82

## 2015-02-28 NOTE — Patient Instructions (Signed)
Please take all new medication as prescribed - the hydralazine for blood pressure  Please continue all other medications as before, and refills have been done if requested.  Please have the pharmacy call with any other refills you may need.  Please continue your efforts at being more active, low cholesterol diet, and weight control.  Please keep your appointments with your specialists as you may have planned  Please go to the LAB in the Basement (turn left off the elevator) for the tests to be done today  You will be contacted by phone if any changes need to be made immediately.  Otherwise, you will receive a letter about your results with an explanation, but please check with MyChart first.  Please remember to sign up for MyChart if you have not done so, as this will be important to you in the future with finding out test results, communicating by private email, and scheduling acute appointments online when needed.  Please return in 3 months, or sooner if needed

## 2015-02-28 NOTE — Progress Notes (Signed)
Pre visit review using our clinic review tool, if applicable. No additional management support is needed unless otherwise documented below in the visit note. 

## 2015-03-01 ENCOUNTER — Other Ambulatory Visit: Payer: Self-pay | Admitting: Internal Medicine

## 2015-03-01 MED ORDER — ATORVASTATIN CALCIUM 40 MG PO TABS: 40.0000 mg | ORAL_TABLET | Freq: Every day | ORAL | Status: DC

## 2015-05-23 ENCOUNTER — Other Ambulatory Visit: Payer: Self-pay | Admitting: Internal Medicine

## 2015-05-31 ENCOUNTER — Encounter: Payer: Self-pay | Admitting: Internal Medicine

## 2015-05-31 ENCOUNTER — Ambulatory Visit (INDEPENDENT_AMBULATORY_CARE_PROVIDER_SITE_OTHER): Payer: 59 | Admitting: Internal Medicine

## 2015-05-31 ENCOUNTER — Other Ambulatory Visit (INDEPENDENT_AMBULATORY_CARE_PROVIDER_SITE_OTHER): Payer: 59

## 2015-05-31 VITALS — BP 138/98 | HR 80 | Temp 97.6°F | Wt 278.0 lb

## 2015-05-31 DIAGNOSIS — R7302 Impaired glucose tolerance (oral): Secondary | ICD-10-CM

## 2015-05-31 DIAGNOSIS — Z0000 Encounter for general adult medical examination without abnormal findings: Secondary | ICD-10-CM

## 2015-05-31 DIAGNOSIS — Z0189 Encounter for other specified special examinations: Secondary | ICD-10-CM | POA: Diagnosis not present

## 2015-05-31 DIAGNOSIS — E785 Hyperlipidemia, unspecified: Secondary | ICD-10-CM

## 2015-05-31 DIAGNOSIS — I10 Essential (primary) hypertension: Secondary | ICD-10-CM | POA: Diagnosis not present

## 2015-05-31 LAB — BASIC METABOLIC PANEL
BUN: 14 mg/dL (ref 6–23)
CO2: 28 mEq/L (ref 19–32)
Calcium: 9.1 mg/dL (ref 8.4–10.5)
Chloride: 103 mEq/L (ref 96–112)
Creatinine, Ser: 1.07 mg/dL (ref 0.40–1.50)
GFR: 90.92 mL/min (ref >60.00)
Glucose, Bld: 95 mg/dL (ref 70–99)
POTASSIUM: 3.8 meq/L (ref 3.5–5.1)
Sodium: 139 mEq/L (ref 135–145)

## 2015-05-31 LAB — HEPATIC FUNCTION PANEL
ALT: 19 U/L (ref 0–53)
AST: 22 U/L (ref 0–37)
Albumin: 4 g/dL (ref 3.5–5.2)
Alkaline Phosphatase: 77 U/L (ref 39–117)
BILIRUBIN TOTAL: 0.5 mg/dL (ref 0.2–1.2)
Bilirubin, Direct: 0 mg/dL (ref 0.0–0.3)
Total Protein: 7.3 g/dL (ref 6.0–8.3)

## 2015-05-31 LAB — LIPID PANEL
CHOL/HDL RATIO: 4
Cholesterol: 173 mg/dL (ref 0–200)
HDL: 44 mg/dL (ref >39.00)
LDL CALC: 89 mg/dL (ref 0–99)
NonHDL: 128.69
TRIGLYCERIDES: 199 mg/dL — AB (ref 0.0–149.0)
VLDL: 39.8 mg/dL (ref 0.0–40.0)

## 2015-05-31 LAB — HEMOGLOBIN A1C: Hgb A1c MFr Bld: 6.1 % (ref 4.6–6.5)

## 2015-05-31 NOTE — Assessment & Plan Note (Signed)
stable overall by history and exam, recent data reviewed with pt, and pt to continue medical treatment as before,  to f/u any worsening symptoms or concerns Lab Results  Component Value Date   HGBA1C 6.0 02/28/2015   To cont working on Diet, exercise, wt loss

## 2015-05-31 NOTE — Assessment & Plan Note (Signed)
Tolerating current statin, trying to follow DM diet,  O/w stable overall by history and exam, recent data reviewed with pt, and pt to continue medical treatment as before,  to f/u any worsening symptoms or concerns Lab Results  Component Value Date   HGBA1C 6.0 02/28/2015

## 2015-05-31 NOTE — Assessment & Plan Note (Signed)
stable overall by history and exam, recent data reviewed with pt, and pt to continue medical treatment as before,  to f/u any worsening symptoms or concerns BP Readings from Last 3 Encounters:  05/31/15 138/98  02/28/15 148/104  11/21/14 159/103

## 2015-05-31 NOTE — Progress Notes (Signed)
Subjective:    Patient ID: Peter Kent, male    DOB: 03-Dec-1955, 59 y.o.   MRN: [id number]  HPI Here to f/u; overall doing ok,  Pt denies chest pain, increasing sob or doe, wheezing, orthopnea, PND, increased LE swelling, palpitations, dizziness or syncope.  Pt denies new neurological symptoms such as new headache, or facial or extremity weakness or numbness.  Pt denies polydipsia, polyuria, or low sugar episode.   Pt denies new neurological symptoms such as new headache, or facial or extremity weakness or numbness.   Pt states overall good compliance with meds, mostly trying to follow appropriate diet, with wt overall stable,  but little exercise however.  Realizes today his is only taking the hydralzene 25 mg once daily instead of tid, but can take it tid. Wt Readings from Last 3 Encounters:  05/31/15 278 lb (126.1 kg)  02/28/15 275 lb (124.739 kg)  11/21/14 275 lb (124.739 kg)    Past Medical History  Diagnosis Date  . ERECTILE DYSFUNCTION 04/08/2007  . HEAD TRAUMA, CLOSED 04/08/2007  . HYPERCHOLESTEROLEMIA 04/08/2007  . HYPERLIPIDEMIA 04/11/2007  . HYPERTENSION 04/08/2007  . Morbid obesity (HCC) 04/08/2007  . Preventative health care 03/22/2011  . Impaired glucose tolerance 07/05/2013  . GERD (gastroesophageal reflux disease)   . Wears glasses   . Full dentures    Past Surgical History  Procedure Laterality Date  . Multiple tooth extractions    . Circumcision    . Umbilical hernia repair N/A 10/19/2013    Procedure: UMBILICAL HERNIA REPAIR WITH MESH;  Surgeon: Ernestene MentionHaywood M Ingram, MD;  Location: [facility];  Service: General;  Laterality: N/A;  . Insertion of mesh N/A 10/19/2013    Procedure: INSERTION OF MESH;  Surgeon: Ernestene MentionHaywood M Ingram, MD;  Location: [facility];  Service: General;  Laterality: N/A;    reports that he has never smoked. He has never used smokeless tobacco. He reports that he does not drink alcohol or use illicit drugs. family history  includes Cancer in his other; Diabetes in his other; Heart disease in his other; Hypertension in his other. There is no history of Colon cancer. No Known Allergies Current Outpatient Prescriptions on File Prior to Visit  Medication Sig Dispense Refill  . aspirin 81 MG tablet Take 81 mg by mouth daily.      Marland Kitchen. atorvastatin (LIPITOR) 40 MG tablet Take 1 tablet (40 mg total) by mouth daily. 90 tablet 3  . hydrALAZINE (APRESOLINE) 25 MG tablet Take 1 tablet (25 mg total) by mouth 3 (three) times daily. 90 tablet 11  . labetalol (NORMODYNE) 200 MG tablet TAKE TWO TABLETS BY MOUTH TWICE DAILY 360 tablet 1  . TRIBENZOR 40-10-25 MG TABS TAKE ONE TABLET BY MOUTH ONCE DAILY 90 tablet 1  . labetalol (NORMODYNE) 200 MG tablet Take 400 mg by mouth 2 (two) times daily.    Marland Kitchen. omeprazole (PRILOSEC) 20 MG capsule Take 1 capsule (20 mg total) by mouth daily as needed. As needed (Patient not taking: Reported on 02/28/2015) 90 capsule 3  . ondansetron (ZOFRAN) 4 MG tablet Take 1 tablet (4 mg total) by mouth every 6 (six) hours. (Patient not taking: Reported on 02/28/2015) 6 tablet 0  . tiZANidine (ZANAFLEX) 4 MG tablet Take 1 tablet (4 mg total) by mouth every 6 (six) hours as needed for muscle spasms. (Patient not taking: Reported on 05/31/2015) 30 tablet 0   No current facility-administered medications on file prior to visit.    Review  of Systems  Constitutional: Negative for unusual diaphoresis or night sweats HENT: Negative for ringing in ear or discharge Eyes: Negative for double vision or worsening visual disturbance.  Respiratory: Negative for choking and stridor.   Gastrointestinal: Negative for vomiting or other signifcant bowel change Genitourinary: Negative for hematuria or change in urine volume.  Musculoskeletal: Negative for other MSK pain or swelling Skin: Negative for color change and worsening wound.  Neurological: Negative for tremors and numbness other than noted  Psychiatric/Behavioral:  Negative for decreased concentration or agitation other than above       Objective:   Physical Exam BP 138/98 mmHg  Pulse 80  Temp(Src) 97.6 F (36.4 C)  Wt 278 lb (126.1 kg)  SpO2 97% VS noted,  Constitutional: Pt appears in no significant distress HENT: Head: NCAT.  Right Ear: External ear normal.  Left Ear: External ear normal.  Eyes: . Pupils are equal, round, and reactive to light. Conjunctivae and EOM are normal Neck: Normal range of motion. Neck supple.  Cardiovascular: Normal rate and regular rhythm.   Pulmonary/Chest: Effort normal and breath sounds without rales or wheezing.  Abd:  Soft, NT, ND, + BS Neurological: Pt is alert. Not confused , motor grossly intact Skin: Skin is warm. No rash, no LE edema Psychiatric: Pt behavior is normal. No agitation.      Assessment & Plan:

## 2015-05-31 NOTE — Patient Instructions (Signed)
Please continue all other medications as before, and refills have been done if requested.  Please have the pharmacy call with any other refills you may need.  Please continue your efforts at being more active, low cholesterol diet, and weight control.  Please keep your appointments with your specialists as you may have planned  Please go to the LAB in the Basement (turn left off the elevator) for the tests to be done today  You will be contacted by phone if any changes need to be made immediately.  Otherwise, you will receive a letter about your results with an explanation, but please check with MyChart first.  Please remember to sign up for MyChart if you have not done so, as this will be important to you in the future with finding out test results, communicating by private email, and scheduling acute appointments online when needed.  Please return in 3 months, or sooner if needed, with Lab testing done 3-5 days before    

## 2015-05-31 NOTE — Progress Notes (Signed)
Patient received education resource, including the self-management goal and tool. Patient verbalized understanding. 

## 2015-09-04 ENCOUNTER — Ambulatory Visit: Payer: 59 | Admitting: Internal Medicine

## 2016-05-13 ENCOUNTER — Telehealth: Payer: Self-pay

## 2016-05-13 NOTE — Telephone Encounter (Signed)
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 Please make patient appointment if he calls back or send him to me.

## 2016-05-16 ENCOUNTER — Other Ambulatory Visit (INDEPENDENT_AMBULATORY_CARE_PROVIDER_SITE_OTHER): Payer: 59

## 2016-05-16 ENCOUNTER — Ambulatory Visit (INDEPENDENT_AMBULATORY_CARE_PROVIDER_SITE_OTHER): Payer: 59 | Admitting: Internal Medicine

## 2016-05-16 ENCOUNTER — Encounter: Payer: Self-pay | Admitting: Internal Medicine

## 2016-05-16 VITALS — BP 142/82 | HR 78 | Temp 98.1°F | Resp 20 | Wt 276.0 lb

## 2016-05-16 DIAGNOSIS — Z0000 Encounter for general adult medical examination without abnormal findings: Secondary | ICD-10-CM

## 2016-05-16 DIAGNOSIS — R7302 Impaired glucose tolerance (oral): Secondary | ICD-10-CM | POA: Diagnosis not present

## 2016-05-16 DIAGNOSIS — I10 Essential (primary) hypertension: Secondary | ICD-10-CM

## 2016-05-16 DIAGNOSIS — F528 Other sexual dysfunction not due to a substance or known physiological condition: Secondary | ICD-10-CM | POA: Diagnosis not present

## 2016-05-16 LAB — LIPID PANEL
CHOL/HDL RATIO: 4
CHOLESTEROL: 197 mg/dL (ref 0–200)
HDL: 51 mg/dL (ref >39.00)
LDL CALC: 123 mg/dL — AB (ref 0–99)
NONHDL: 146.34
Triglycerides: 116 mg/dL (ref 0.0–149.0)
VLDL: 23.2 mg/dL (ref 0.0–40.0)

## 2016-05-16 LAB — CBC WITH DIFFERENTIAL/PLATELET
Basophils Absolute: 0 10*3/uL (ref 0.0–0.1)
Basophils Relative: 0.6 % (ref 0.0–3.0)
EOS ABS: 0.1 10*3/uL (ref 0.0–0.7)
Eosinophils Relative: 1.5 % (ref 0.0–5.0)
HEMATOCRIT: 41.6 % (ref 39.0–52.0)
HEMOGLOBIN: 14 g/dL (ref 13.0–17.0)
LYMPHS PCT: 29 % (ref 12.0–46.0)
Lymphs Abs: 2.1 10*3/uL (ref 0.7–4.0)
MCHC: 33.7 g/dL (ref 30.0–36.0)
MCV: 86.1 fl (ref 78.0–100.0)
MONO ABS: 0.7 10*3/uL (ref 0.1–1.0)
Monocytes Relative: 9 % (ref 3.0–12.0)
Neutro Abs: 4.4 10*3/uL (ref 1.4–7.7)
Neutrophils Relative %: 59.9 % (ref 43.0–77.0)
Platelets: 251 10*3/uL (ref 150.0–400.0)
RBC: 4.83 Mil/uL (ref 4.22–5.81)
RDW: 14.6 % (ref 11.5–15.5)
WBC: 7.4 10*3/uL (ref 4.0–10.5)

## 2016-05-16 LAB — BASIC METABOLIC PANEL
BUN: 13 mg/dL (ref 6–23)
CO2: 28 mEq/L (ref 19–32)
CREATININE: 1.09 mg/dL (ref 0.40–1.50)
Calcium: 9.2 mg/dL (ref 8.4–10.5)
Chloride: 102 mEq/L (ref 96–112)
GFR: 88.7 mL/min (ref >60.00)
GLUCOSE: 113 mg/dL — AB (ref 70–99)
POTASSIUM: 3.9 meq/L (ref 3.5–5.1)
Sodium: 138 mEq/L (ref 135–145)

## 2016-05-16 LAB — HEPATIC FUNCTION PANEL
ALBUMIN: 3.9 g/dL (ref 3.5–5.2)
ALT: 23 U/L (ref 0–53)
AST: 23 U/L (ref 0–37)
Alkaline Phosphatase: 87 U/L (ref 39–117)
Bilirubin, Direct: 0.1 mg/dL (ref 0.0–0.3)
Total Bilirubin: 0.4 mg/dL (ref 0.2–1.2)
Total Protein: 7.1 g/dL (ref 6.0–8.3)

## 2016-05-16 LAB — TSH: TSH: 1.59 u[IU]/mL (ref 0.35–4.50)

## 2016-05-16 LAB — URINALYSIS, ROUTINE W REFLEX MICROSCOPIC
BILIRUBIN URINE: NEGATIVE
HGB URINE DIPSTICK: NEGATIVE
KETONES UR: NEGATIVE
LEUKOCYTES UA: NEGATIVE
NITRITE: NEGATIVE
RBC / HPF: NONE SEEN (ref 0–?)
Specific Gravity, Urine: 1.015 (ref 1.000–1.030)
TOTAL PROTEIN, URINE-UPE24: NEGATIVE
URINE GLUCOSE: NEGATIVE
UROBILINOGEN UA: 0.2 (ref 0.0–1.0)
pH: 6 (ref 5.0–8.0)

## 2016-05-16 LAB — PSA: PSA: 0.64 ng/mL (ref 0.10–4.00)

## 2016-05-16 LAB — HEMOGLOBIN A1C: Hgb A1c MFr Bld: 6.2 % (ref 4.6–6.5)

## 2016-05-16 MED ORDER — SILDENAFIL CITRATE 100 MG PO TABS: 50.0000 mg | ORAL_TABLET | Freq: Every day | ORAL | 11 refills | Status: DC | PRN

## 2016-05-16 NOTE — Assessment & Plan Note (Signed)
Ok for viagra prn,  to f/u any worsening symptoms or concerns j

## 2016-05-16 NOTE — Assessment & Plan Note (Signed)

## 2016-05-16 NOTE — Assessment & Plan Note (Signed)
stable overall by history and exam, recent data reviewed with pt, and pt to continue medical treatment as before,  to f/u any worsening symptoms or concerns BP Readings from Last 3 Encounters:  05/16/16 (!) 142/82  05/31/15 (!) 138/98  02/28/15 (!) 148/104

## 2016-05-16 NOTE — Progress Notes (Signed)
Pre visit review using our clinic review tool, if applicable. No additional management support is needed unless otherwise documented below in the visit note. 

## 2016-05-16 NOTE — Progress Notes (Signed)
Subjective:    Patient ID: Peter Kent, male    DOB: February 04, 1956, 60 y.o.   MRN: [id number]  HPI  /Here for wellness and f/u;  Overall doing ok;  Pt denies Chest pain, worsening SOB, DOE, wheezing, orthopnea, PND, worsening LE edema, palpitations, dizziness or syncope.  Pt denies neurological change such as new headache, facial or extremity weakness.  Pt denies polydipsia, polyuria, or low sugar symptoms. Pt states overall good compliance with treatment and medications, good tolerability, and has been trying to follow appropriate diet.  Pt denies worsening depressive symptoms, suicidal ideation or panic. No fever, night sweats, wt loss, loss of appetite, or other constitutional symptoms.  Pt states good ability with ADL's, has low fall risk, home safety reviewed and adequate, no other significant changes in hearing or vision, and only occasionally active with exercise. 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  Needs DMV forms filled out for driving  Asks for viagra due to worsening ED symptoms in the past 6 mo., used to take cialis  BP at home has been < 140/90, declines med change today Past Medical History:  Diagnosis Date  . ERECTILE DYSFUNCTION 04/08/2007  . Full dentures   . GERD (gastroesophageal reflux disease)   . HEAD TRAUMA, CLOSED 04/08/2007  . HYPERCHOLESTEROLEMIA 04/08/2007  . HYPERLIPIDEMIA 04/11/2007  . HYPERTENSION 04/08/2007  . Impaired glucose tolerance 07/05/2013  . Morbid obesity (HCC) 04/08/2007  . Preventative health care 03/22/2011  . Wears glasses    Past Surgical History:  Procedure Laterality Date  . CIRCUMCISION    . INSERTION OF MESH N/A 10/19/2013   Procedure: INSERTION OF MESH;  Surgeon: Ernestene Mention, MD;  Location: [facility];  Service: General;   Laterality: N/A;  . MULTIPLE TOOTH EXTRACTIONS    . UMBILICAL HERNIA REPAIR N/A 10/19/2013   Procedure: UMBILICAL HERNIA REPAIR WITH MESH;  Surgeon: Ernestene Mention, MD;  Location: [facility];  Service: General;  Laterality: N/A;    reports that he has never smoked. He has never used smokeless tobacco. He reports that he does not drink alcohol or use drugs. family history includes Cancer in his other; Diabetes in his other; Heart disease in his other; Hypertension in his other. No Known Allergies Current Outpatient Prescriptions on File Prior to Visit  Medication Sig Dispense Refill  . aspirin 81 MG tablet Take 81 mg by mouth daily.      Marland Kitchen atorvastatin (LIPITOR) 40 MG tablet Take 1 tablet (40 mg total) by mouth daily. 90 tablet 3  . hydrALAZINE (APRESOLINE) 25 MG tablet Take 1 tablet (25 mg total) by mouth 3 (three) times daily. 90 tablet 11  . labetalol (NORMODYNE) 200 MG tablet Take 400 mg by mouth 2 (two) times daily.    . ondansetron (ZOFRAN) 4 MG tablet Take 1 tablet (4 mg total) by mouth every 6 (six) hours. 6 tablet 0  . TRIBENZOR 40-10-25 MG TABS TAKE ONE TABLET BY MOUTH ONCE DAILY 90 tablet 1   No current facility-administered medications on file prior to visit.     Review of Systems Constitutional: Negative for increased diaphoresis, or other activity, appetite or siginficant weight change other than noted HENT: Negative for worsening hearing loss, ear pain, facial swelling, mouth sores and  neck stiffness.   Eyes: Negative for other worsening pain, redness or visual disturbance.  Respiratory: Negative for choking or stridor Cardiovascular: Negative for other chest pain and palpitations.  Gastrointestinal: Negative for worsening diarrhea, blood in stool, or abdominal distention Genitourinary: Negative for hematuria, flank pain or change in urine volume.  Musculoskeletal: Negative for myalgias or other joint complaints.  Skin: Negative for other color change and  wound or drainage.  Neurological: Negative for syncope and numbness. other than noted Hematological: Negative for adenopathy. or other swelling Psychiatric/Behavioral: Negative for hallucinations, SI, self-injury, decreased concentration or other worsening agitation.      Objective:   Physical Exam BP (!) 142/82   Pulse 78   Temp 98.1 F (36.7 C) (Oral)   Resp 20   Wt 276 lb (125.2 kg)   SpO2 97%   BMI 35.44 kg/m  VS noted, obese Constitutional: Pt is oriented to person, place, and time. Appears well-developed and well-nourished, in no significant distress Head: Normocephalic and atraumatic  Eyes: Conjunctivae and EOM are normal. Pupils are equal, round, and reactive to light Right Ear: External ear normal.  Left Ear: External ear normal Nose: Nose normal.  Mouth/Throat: Oropharynx is clear and moist  Neck: Normal range of motion. Neck supple. No JVD present. No tracheal deviation present or significant neck LA or mass Cardiovascular: Normal rate, regular rhythm, normal heart sounds and intact distal pulses.  Pulmonary/Chest: Effort normal and breath sounds without rales or wheezing  Abdominal: Soft. Bowel sounds are normal. NT. No HSM  Musculoskeletal: Normal range of motion. Exhibits no edema Lymphadenopathy: Has no cervical adenopathy.  Neurological: Pt is alert and oriented to person, place, and time. Pt has normal reflexes. No cranial nerve deficit. Motor grossly intact Skin: Skin is warm and dry. No rash noted or new ulcers Psychiatric:  Has mild nervous mood and affect. Behavior is normal.     Assessment & Plan:

## 2016-05-16 NOTE — Patient Instructions (Addendum)
You had the flu shot today  Please take all new medication as prescribed - the viagra  Please continue all other medications as before, and refills have been done if requested.  Please have the pharmacy call with any other refills you may need.  Please continue your efforts at being more active, low cholesterol diet, and weight control.  You are otherwise up to date with prevention measures today.  Please keep your appointments with your specialists as you may have planned  Please go to the LAB in the Basement (turn left off the elevator) for the tests to be done today  You will be contacted by phone if any changes need to be made immediately.  Otherwise, you will receive a letter about your results with an explanation, but please check with MyChart first.  Please remember to sign up for MyChart if you have not done so, as this will be important to you in the future with finding out test results, communicating by private email, and scheduling acute appointments online when needed.  Please return in 1 year for your yearly visit, or sooner if needed, with Lab testing done 3-5 days before  

## 2016-05-16 NOTE — Assessment & Plan Note (Signed)
stable overall by history and exam, recent data reviewed with pt, and pt to continue medical treatment as before,  to f/u any worsening symptoms or concerns Lab Results  Component Value Date   HGBA1C 6.1 05/31/2015   For f/u a1c

## 2016-06-18 ENCOUNTER — Telehealth: Payer: Self-pay

## 2016-06-18 NOTE — Telephone Encounter (Signed)
Paperwork received, completed and placed on MD desk for signature

## 2016-06-18 NOTE — Telephone Encounter (Signed)
Forms signed, faxed, copy sent to scan

## 2016-06-23 NOTE — Telephone Encounter (Signed)
Original given to patient in office, copy sent to scan

## 2016-06-26 ENCOUNTER — Other Ambulatory Visit: Payer: Self-pay | Admitting: Internal Medicine

## 2016-10-28 ENCOUNTER — Ambulatory Visit (INDEPENDENT_AMBULATORY_CARE_PROVIDER_SITE_OTHER): Payer: 59 | Admitting: Internal Medicine

## 2016-10-28 ENCOUNTER — Encounter: Payer: Self-pay | Admitting: Internal Medicine

## 2016-10-28 VITALS — BP 146/82 | HR 104 | Temp 98.3°F | Ht 74.0 in | Wt 278.0 lb

## 2016-10-28 DIAGNOSIS — I10 Essential (primary) hypertension: Secondary | ICD-10-CM

## 2016-10-28 MED ORDER — HYDRALAZINE HCL 50 MG PO TABS: 50.0000 mg | ORAL_TABLET | Freq: Three times a day (TID) | ORAL | 11 refills | Status: DC

## 2016-10-28 NOTE — Patient Instructions (Signed)
OK to increase the Hydralazine to 50 mg three times per day  Please continue all other medications as before, and refills have been done if requested.  Please have the pharmacy call with any other refills you may need.  Please continue your efforts at being more active, low cholesterol diet, and weight control.  Please keep your appointments with your specialists as you may have planned  Please return in 1 week, or sooner if needed

## 2016-10-28 NOTE — Progress Notes (Signed)
Subjective:    Patient ID: Peter Kent, male    DOB: 13-Sep-1955, 61 y.o.   MRN: [id number]  HPI  Here to f/u; overall doing ok,  Pt denies chest pain, increasing sob or doe, wheezing, orthopnea, PND, increased LE swelling, palpitations, dizziness or syncope.  Pt denies new neurological symptoms such as new headache, or facial or extremity weakness or numbness.  Pt denies polydipsia, polyuria, or low sugar episode.   Pt denies new neurological symptoms such as new headache, or facial or extremity weakness or numbness.   Pt states overall good compliance with meds, mostly trying to follow appropriate diet, with wt overall stable,  but little exercise however, and BP has been elevated recently > 140/90 Past Medical History:  Diagnosis Date  . ERECTILE DYSFUNCTION 04/08/2007  . Full dentures   . GERD (gastroesophageal reflux disease)   . HEAD TRAUMA, CLOSED 04/08/2007  . HYPERCHOLESTEROLEMIA 04/08/2007  . HYPERLIPIDEMIA 04/11/2007  . HYPERTENSION 04/08/2007  . Impaired glucose tolerance 07/05/2013  . Morbid obesity (HCC) 04/08/2007  . Preventative health care 03/22/2011  . Wears glasses    Past Surgical History:  Procedure Laterality Date  . CIRCUMCISION    . INSERTION OF MESH N/A 10/19/2013   Procedure: INSERTION OF MESH;  Surgeon: Ernestene Mention, MD;  Location: [facility];  Service: General;  Laterality: N/A;  . MULTIPLE TOOTH EXTRACTIONS    . UMBILICAL HERNIA REPAIR N/A 10/19/2013   Procedure: UMBILICAL HERNIA REPAIR WITH MESH;  Surgeon: Ernestene Mention, MD;  Location: [facility];  Service: General;  Laterality: N/A;    reports that he has never smoked. He has never used smokeless tobacco. He reports that he does not drink alcohol or use drugs. family history includes Cancer in his other; Diabetes in his other; Heart disease in his other; Hypertension in his other. No Known Allergies Current Outpatient Prescriptions on File Prior to Visit  Medication Sig  Dispense Refill  . aspirin 81 MG tablet Take 81 mg by mouth daily.      Marland Kitchen atorvastatin (LIPITOR) 40 MG tablet Take 1 tablet (40 mg total) by mouth daily. 90 tablet 3  . labetalol (NORMODYNE) 200 MG tablet Take 400 mg by mouth 2 (two) times daily.    . Olmesartan-Amlodipine-HCTZ 40-10-25 MG TABS TAKE ONE TABLET BY MOUTH ONCE DAILY 30 tablet 5  . ondansetron (ZOFRAN) 4 MG tablet Take 1 tablet (4 mg total) by mouth every 6 (six) hours. 6 tablet 0  . sildenafil (VIAGRA) 100 MG tablet Take 0.5-1 tablets (50-100 mg total) by mouth daily as needed for erectile dysfunction. 5 tablet 11   No current facility-administered medications on file prior to visit.    Review of Systems   All other system neg per pt    Objective:   Physical Exam BP (!) 146/82 (BP Location: Left Arm, Patient Position: Sitting, Cuff Size: Normal)   Pulse (!) 104   Temp 98.3 F (36.8 C) (Oral)   Ht 6\' 2"  (1.88 m)   Wt 278 lb (126.1 kg)   SpO2 98%   BMI 35.69 kg/m  VS noted,  Constitutional: Pt appears in no apparent distress HENT: Head: NCAT.  Right Ear: External ear normal.  Left Ear: External ear normal.  Eyes: . Pupils are equal, round, and reactive to light. Conjunctivae and EOM are normal Neck: Normal range of motion. Neck supple.  Cardiovascular: Normal rate and regular rhythm.   Pulmonary/Chest: Effort normal and breath sounds without  rales or wheezing.  Abd:  Soft, NT, ND, + BS Neurological: Pt is alert. Not confused , motor grossly intact Skin: Skin is warm. No rash, no LE edema Psychiatric: Pt behavior is normal. No agitation.  No other exam findings    Assessment & Plan:

## 2016-10-28 NOTE — Progress Notes (Signed)
Pre visit review using our clinic review tool, if applicable. No additional management support is needed unless otherwise documented below in the visit note. 

## 2016-11-02 NOTE — Assessment & Plan Note (Signed)
Uncontrolled,  BP Readings from Last 3 Encounters:  10/28/16 (!) 146/82  05/16/16 (!) 142/82  05/31/15 (!) 138/98  to increase hydralazine 50 tid, cont all other tx,  to f/u any worsening symptoms or concerns

## 2016-11-04 ENCOUNTER — Ambulatory Visit (INDEPENDENT_AMBULATORY_CARE_PROVIDER_SITE_OTHER): Payer: 59 | Admitting: Internal Medicine

## 2016-11-04 ENCOUNTER — Encounter: Payer: Self-pay | Admitting: Internal Medicine

## 2016-11-04 VITALS — BP 138/76 | HR 85 | Temp 98.0°F | Resp 12 | Ht 74.0 in | Wt 275.0 lb

## 2016-11-04 DIAGNOSIS — R7302 Impaired glucose tolerance (oral): Secondary | ICD-10-CM | POA: Diagnosis not present

## 2016-11-04 DIAGNOSIS — E785 Hyperlipidemia, unspecified: Secondary | ICD-10-CM

## 2016-11-04 DIAGNOSIS — Z0000 Encounter for general adult medical examination without abnormal findings: Secondary | ICD-10-CM

## 2016-11-04 DIAGNOSIS — I10 Essential (primary) hypertension: Secondary | ICD-10-CM

## 2016-11-04 NOTE — Patient Instructions (Signed)
Please continue all other medications as before, and refills have been done if requested.  Please have the pharmacy call with any other refills you may need.  Please continue your efforts at being more active, low cholesterol diet, and weight control..  Please keep your appointments with your specialists as you may have planned  Please return in 6 months, or sooner if needed, with Lab testing done 3-5 days before  

## 2016-11-04 NOTE — Assessment & Plan Note (Addendum)
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

## 2016-11-04 NOTE — Assessment & Plan Note (Addendum)
stable overall by history and exam, and pt to continue medical treatment as before,  to f/u any worsening symptoms or concerns Lab Results  Component Value Date   LDLCALC 123 (H) 05/16/2016  now on lipitor 40 , to cont diet, declines labs today

## 2016-11-04 NOTE — Progress Notes (Signed)
Subjective:    Patient ID: Peter Kent, male    DOB: 20-May-1956, 61 y.o.   MRN: [id number]  HPI  Here to f/u; overall doing ok,  Pt denies chest pain, increasing sob or doe, wheezing, orthopnea, PND, increased LE swelling, palpitations, dizziness or syncope.  Pt denies new neurological symptoms such as new headache, or facial or extremity weakness or numbness.  Pt denies polydipsia, polyuria, or low sugar episode.   Pt denies new neurological symptoms such as new headache, or facial or extremity weakness or numbness.   Pt states overall good compliance with meds, mostly trying to follow diet, Lost 3 lbs already with better diet and active .  Wt Readings from Last 3 Encounters:  11/04/16 275 lb (124.7 kg)  10/28/16 278 lb (126.1 kg)  05/16/16 276 lb (125.2 kg)  Mother died 2 days ago. But not unexpected.  Tolerating new BP well. Past Medical History:  Diagnosis Date  . ERECTILE DYSFUNCTION 04/08/2007  . Full dentures   . GERD (gastroesophageal reflux disease)   . HEAD TRAUMA, CLOSED 04/08/2007  . HYPERCHOLESTEROLEMIA 04/08/2007  . HYPERLIPIDEMIA 04/11/2007  . HYPERTENSION 04/08/2007  . Impaired glucose tolerance 07/05/2013  . Morbid obesity (HCC) 04/08/2007  . Preventative health care 03/22/2011  . Wears glasses    Past Surgical History:  Procedure Laterality Date  . CIRCUMCISION    . INSERTION OF MESH N/A 10/19/2013   Procedure: INSERTION OF MESH;  Surgeon: Ernestene MentionHaywood M Ingram, MD;  Location: [facility];  Service: General;  Laterality: N/A;  . MULTIPLE TOOTH EXTRACTIONS    . UMBILICAL HERNIA REPAIR N/A 10/19/2013   Procedure: UMBILICAL HERNIA REPAIR WITH MESH;  Surgeon: Ernestene MentionHaywood M Ingram, MD;  Location: [facility];  Service: General;  Laterality: N/A;    reports that he has never smoked. He has never used smokeless tobacco. He reports that he does not drink alcohol or use drugs. family history includes Cancer in his other; Diabetes in his other; Heart disease  in his other; Hypertension in his other. No Known Allergies Current Outpatient Prescriptions on File Prior to Visit  Medication Sig Dispense Refill  . aspirin 81 MG tablet Take 81 mg by mouth daily.      Marland Kitchen. atorvastatin (LIPITOR) 40 MG tablet Take 1 tablet (40 mg total) by mouth daily. 90 tablet 3  . hydrALAZINE (APRESOLINE) 50 MG tablet Take 1 tablet (50 mg total) by mouth 3 (three) times daily. 90 tablet 11  . labetalol (NORMODYNE) 200 MG tablet Take 400 mg by mouth 2 (two) times daily.    . Olmesartan-Amlodipine-HCTZ 40-10-25 MG TABS TAKE ONE TABLET BY MOUTH ONCE DAILY 30 tablet 5  . ondansetron (ZOFRAN) 4 MG tablet Take 1 tablet (4 mg total) by mouth every 6 (six) hours. 6 tablet 0  . sildenafil (VIAGRA) 100 MG tablet Take 0.5-1 tablets (50-100 mg total) by mouth daily as needed for erectile dysfunction. 5 tablet 11   No current facility-administered medications on file prior to visit.    Review of Systems  VS noted,  Constitutional: Pt appears in no apparent distress HENT: Head: NCAT.  Right Ear: External ear normal.  Left Ear: External ear normal.  Eyes: . Pupils are equal, round, and reactive to light. Conjunctivae and EOM are normal Neck: Normal range of motion. Neck supple.  Cardiovascular: Normal rate and regular rhythm.   Pulmonary/Chest: Effort normal and breath sounds without rales or wheezing.  Abd:  Soft, NT, ND, + BS Neurological:  Pt is alert. Not confused , motor grossly intact Skin: Skin is warm. No rash, no LE edema Psychiatric: Pt behavior is normal. No agitation.  All otherwise neg per pt    Objective:   Physical Exam BP 138/76 (BP Location: Left Arm, Patient Position: Sitting, Cuff Size: Large)   Pulse 85   Temp 98 F (36.7 C) (Oral)   Resp 12   Ht 6\' 2"  (1.88 m)   Wt 275 lb (124.7 kg)   SpO2 99%   BMI 35.31 kg/m  VS noted, not ill appaering Constitutional: Pt appears in no apparent distress HENT: Head: NCAT.  Right Ear: External ear normal.  Left  Ear: External ear normal.  Eyes: . Pupils are equal, round, and reactive to light. Conjunctivae and EOM are normal Neck: Normal range of motion. Neck supple.  Cardiovascular: Normal rate and regular rhythm.   Pulmonary/Chest: Effort normal and breath sounds without rales or wheezing.  Neurological: Pt is alert. Not confused , motor grossly intact Skin: Skin is warm. No rash, no LE edema Psychiatric: Pt behavior is normal. No agitation.  No other exam fidnings    Assessment & Plan:

## 2016-11-04 NOTE — Assessment & Plan Note (Signed)
stable overall by history and exam, recent data reviewed with pt, and pt to continue medical treatment as before,  to f/u any worsening symptoms or concerns Lab Results  Component Value Date   HGBA1C 6.2 05/16/2016

## 2016-11-04 NOTE — Progress Notes (Signed)
Pre visit review using our clinic review tool, if applicable. No additional management support is needed unless otherwise documented below in the visit note. 

## 2017-06-11 ENCOUNTER — Other Ambulatory Visit: Payer: Self-pay | Admitting: Internal Medicine

## 2017-09-30 ENCOUNTER — Other Ambulatory Visit: Payer: Self-pay | Admitting: Internal Medicine

## 2017-09-30 ENCOUNTER — Other Ambulatory Visit (INDEPENDENT_AMBULATORY_CARE_PROVIDER_SITE_OTHER): Payer: 59

## 2017-09-30 ENCOUNTER — Encounter: Payer: Self-pay | Admitting: Internal Medicine

## 2017-09-30 ENCOUNTER — Ambulatory Visit (INDEPENDENT_AMBULATORY_CARE_PROVIDER_SITE_OTHER): Payer: 59 | Admitting: Internal Medicine

## 2017-09-30 VITALS — BP 146/98 | HR 101 | Temp 97.7°F | Ht 74.0 in | Wt 267.0 lb

## 2017-09-30 DIAGNOSIS — R7302 Impaired glucose tolerance (oral): Secondary | ICD-10-CM

## 2017-09-30 DIAGNOSIS — I10 Essential (primary) hypertension: Secondary | ICD-10-CM

## 2017-09-30 DIAGNOSIS — Z1159 Encounter for screening for other viral diseases: Secondary | ICD-10-CM

## 2017-09-30 DIAGNOSIS — Z114 Encounter for screening for human immunodeficiency virus [HIV]: Secondary | ICD-10-CM

## 2017-09-30 DIAGNOSIS — Z23 Encounter for immunization: Secondary | ICD-10-CM

## 2017-09-30 DIAGNOSIS — Z0000 Encounter for general adult medical examination without abnormal findings: Secondary | ICD-10-CM | POA: Diagnosis not present

## 2017-09-30 LAB — URINALYSIS, ROUTINE W REFLEX MICROSCOPIC
Bilirubin Urine: NEGATIVE
Ketones, ur: NEGATIVE
Leukocytes, UA: NEGATIVE
Nitrite: NEGATIVE
SPECIFIC GRAVITY, URINE: 1.02 (ref 1.000–1.030)
Total Protein, Urine: NEGATIVE
Urine Glucose: NEGATIVE
Urobilinogen, UA: 0.2 (ref 0.0–1.0)
pH: 6.5 (ref 5.0–8.0)

## 2017-09-30 LAB — CBC WITH DIFFERENTIAL/PLATELET
Basophils Absolute: 0.1 10*3/uL (ref 0.0–0.1)
Basophils Relative: 0.7 % (ref 0.0–3.0)
EOS PCT: 0.4 % (ref 0.0–5.0)
Eosinophils Absolute: 0 10*3/uL (ref 0.0–0.7)
HEMATOCRIT: 42.7 % (ref 39.0–52.0)
HEMOGLOBIN: 14.2 g/dL (ref 13.0–17.0)
LYMPHS PCT: 26 % (ref 12.0–46.0)
Lymphs Abs: 2 10*3/uL (ref 0.7–4.0)
MCHC: 33.3 g/dL (ref 30.0–36.0)
MCV: 88.1 fl (ref 78.0–100.0)
MONO ABS: 0.6 10*3/uL (ref 0.1–1.0)
Monocytes Relative: 7.4 % (ref 3.0–12.0)
Neutro Abs: 5 10*3/uL (ref 1.4–7.7)
Neutrophils Relative %: 65.5 % (ref 43.0–77.0)
Platelets: 266 10*3/uL (ref 150.0–400.0)
RBC: 4.85 Mil/uL (ref 4.22–5.81)
RDW: 14.8 % (ref 11.5–15.5)
WBC: 7.7 10*3/uL (ref 4.0–10.5)

## 2017-09-30 LAB — BASIC METABOLIC PANEL
BUN: 16 mg/dL (ref 6–23)
CALCIUM: 9.6 mg/dL (ref 8.4–10.5)
CO2: 26 mEq/L (ref 19–32)
Chloride: 100 mEq/L (ref 96–112)
Creatinine, Ser: 1.13 mg/dL (ref 0.40–1.50)
GFR: 84.7 mL/min (ref >60.00)
GLUCOSE: 91 mg/dL (ref 70–99)
Potassium: 3.9 mEq/L (ref 3.5–5.1)
SODIUM: 138 meq/L (ref 135–145)

## 2017-09-30 LAB — HEPATIC FUNCTION PANEL
ALT: 17 U/L (ref 0–53)
AST: 21 U/L (ref 0–37)
Albumin: 4.2 g/dL (ref 3.5–5.2)
Alkaline Phosphatase: 86 U/L (ref 39–117)
BILIRUBIN TOTAL: 0.5 mg/dL (ref 0.2–1.2)
Bilirubin, Direct: 0.1 mg/dL (ref 0.0–0.3)
TOTAL PROTEIN: 7.5 g/dL (ref 6.0–8.3)

## 2017-09-30 LAB — PSA: PSA: 0.86 ng/mL (ref 0.10–4.00)

## 2017-09-30 LAB — LIPID PANEL
Cholesterol: 227 mg/dL — ABNORMAL HIGH (ref 0–200)
HDL: 51.8 mg/dL (ref >39.00)
LDL Cholesterol: 154 mg/dL — ABNORMAL HIGH (ref 0–99)
NONHDL: 174.93
Total CHOL/HDL Ratio: 4
Triglycerides: 104 mg/dL (ref 0.0–149.0)
VLDL: 20.8 mg/dL (ref 0.0–40.0)

## 2017-09-30 LAB — TSH: TSH: 1.81 u[IU]/mL (ref 0.35–4.50)

## 2017-09-30 LAB — HEMOGLOBIN A1C: Hgb A1c MFr Bld: 6.3 % (ref 4.6–6.5)

## 2017-09-30 MED ORDER — ATORVASTATIN CALCIUM 80 MG PO TABS: 80.0000 mg | ORAL_TABLET | Freq: Every day | ORAL | 3 refills | Status: DC

## 2017-09-30 NOTE — Assessment & Plan Note (Signed)
Mild elev today, pt states BP at home < 140/90, cont same tx

## 2017-09-30 NOTE — Progress Notes (Signed)
Subjective:    Patient ID: Peter Kent, male    DOB: [birth year], 62 y.o.   MRN: [id number]  HPI  Here for wellness and f/u;  Overall doing ok;  Pt denies Chest pain, worsening SOB, DOE, wheezing, orthopnea, PND, worsening LE edema, palpitations, dizziness or syncope.  Pt denies neurological change such as new headache, facial or extremity weakness.  Pt denies polydipsia, polyuria, or low sugar symptoms. Pt states overall good compliance with treatment and medications, good tolerability, and has been trying to follow appropriate diet.  Pt denies worsening depressive symptoms, suicidal ideation or panic. No fever, night sweats, wt loss, loss of appetite, or other constitutional symptoms.  Pt states good ability with ADL's, has low fall risk, home safety reviewed and adequate, no other significant changes in hearing or vision, and only occasionally active with exercise. [place], lost 11 lbs with better diet and activity.   Wt Readings from Last 3 Encounters:  09/30/17 267 lb (121.1 kg)  11/04/16 275 lb (124.7 kg)  10/28/16 278 lb (126.1 kg)  Retiring Mar 1 from [employer]  No other complaints or new hx Past Medical History:  Diagnosis Date  . ERECTILE DYSFUNCTION 04/08/2007  . Full dentures   . GERD (gastroesophageal reflux disease)   . HEAD TRAUMA, CLOSED 04/08/2007  . HYPERCHOLESTEROLEMIA 04/08/2007  . HYPERLIPIDEMIA 04/11/2007  . HYPERTENSION 04/08/2007  . Impaired glucose tolerance 07/05/2013  . Morbid obesity (HCC) 04/08/2007  . Preventative health care 03/22/2011  . Wears glasses    Past Surgical History:  Procedure Laterality Date  . CIRCUMCISION    . INSERTION OF MESH N/A 10/19/2013   Procedure: INSERTION OF MESH;  Surgeon: Ernestene Mention, MD;  Location: [facility];  Service: General;  Laterality: N/A;  . MULTIPLE TOOTH EXTRACTIONS    . UMBILICAL HERNIA REPAIR N/A 10/19/2013   Procedure: UMBILICAL HERNIA REPAIR WITH MESH;  Surgeon: Ernestene Mention, MD;   Location: [facility];  Service: General;  Laterality: N/A;    reports that  has never smoked. he has never used smokeless tobacco. He reports that he does not drink alcohol or use drugs. family history includes Cancer in his other; Diabetes in his other; Heart disease in his other; Hypertension in his other. No Known Allergies Current Outpatient Medications on File Prior to Visit  Medication Sig Dispense Refill  . aspirin 81 MG tablet Take 81 mg by mouth daily.      . hydrALAZINE (APRESOLINE) 50 MG tablet Take 1 tablet (50 mg total) by mouth 3 (three) times daily. 90 tablet 11  . labetalol (NORMODYNE) 200 MG tablet Take 400 mg by mouth 2 (two) times daily.    . Olmesartan-Amlodipine-HCTZ 40-10-25 MG TABS TAKE ONE TABLET BY MOUTH ONCE DAILY 30 tablet 4  . ondansetron (ZOFRAN) 4 MG tablet Take 1 tablet (4 mg total) by mouth every 6 (six) hours. 6 tablet 0  . sildenafil (VIAGRA) 100 MG tablet Take 0.5-1 tablets (50-100 mg total) by mouth daily as needed for erectile dysfunction. 5 tablet 11   No current facility-administered medications on file prior to visit.    Review of Systems Constitutional: Negative for other unusual diaphoresis, sweats, appetite or weight changes HENT: Negative for other worsening hearing loss, ear pain, facial swelling, mouth sores or neck stiffness.   Eyes: Negative for other worsening pain, redness or other visual disturbance.  Respiratory: Negative for other stridor or swelling Cardiovascular: Negative for other palpitations or other chest pain  Gastrointestinal: Negative for worsening diarrhea or loose stools, blood in stool, distention or other pain Genitourinary: Negative for hematuria, flank pain or other change in urine volume.  Musculoskeletal: Negative for myalgias or other joint swelling.  Skin: Negative for other color change, or other wound or worsening drainage.  Neurological: Negative for other syncope or numbness. Hematological: Negative  for other adenopathy or swelling Psychiatric/Behavioral: Negative for hallucinations, other worsening agitation, SI, self-injury, or new decreased concentration All other system neg per pt    Objective:   Physical Exam BP (!) 146/98   Pulse (!) 101   Temp 97.7 F (36.5 C) (Oral)   Ht 6\' 2"  (1.88 m)   Wt 267 lb (121.1 kg)   SpO2 99%   BMI 34.28 kg/m  VS noted, obese Constitutional: Pt is oriented to person, place, and time. Appears well-developed and well-nourished, in no significant distress and comfortable Head: Normocephalic and atraumatic  Eyes: Conjunctivae and EOM are normal. Pupils are equal, round, and reactive to light Right Ear: External ear normal without discharge Left Ear: External ear normal without discharge Nose: Nose without discharge or deformity Mouth/Throat: Oropharynx is without other ulcerations and moist  Neck: Normal range of motion. Neck supple. No JVD present. No tracheal deviation present or significant neck LA or mass Cardiovascular: Normal rate, regular rhythm, normal heart sounds and intact distal pulses.   Pulmonary/Chest: WOB normal and breath sounds without rales or wheezing  Abdominal: Soft. Bowel sounds are normal. NT. No HSM  Musculoskeletal: Normal range of motion. Exhibits no edema Lymphadenopathy: Has no other cervical adenopathy.  Neurological: Pt is alert and oriented to person, place, and time. Pt has normal reflexes. No cranial nerve deficit. Motor grossly intact, Gait intact Skin: Skin is warm and dry. No rash noted or new ulcerations Psychiatric:  Has normal mood and affect. Behavior is normal without agitation No other exam findings Lab Results  Component Value Date   WBC 7.7 09/30/2017   HGB 14.2 09/30/2017   HCT 42.7 09/30/2017   PLT 266.0 09/30/2017   GLUCOSE 91 09/30/2017   CHOL 227 (H) 09/30/2017   TRIG 104.0 09/30/2017   HDL 51.80 09/30/2017   LDLDIRECT 156.1 09/23/2007   LDLCALC 154 (H) 09/30/2017   ALT 17 09/30/2017    AST 21 09/30/2017   NA 138 09/30/2017   K 3.9 09/30/2017   CL 100 09/30/2017   CREATININE 1.13 09/30/2017   BUN 16 09/30/2017   CO2 26 09/30/2017   TSH 1.81 09/30/2017   PSA 0.86 09/30/2017   HGBA1C 6.3 09/30/2017       Assessment & Plan:

## 2017-09-30 NOTE — Assessment & Plan Note (Signed)
Lab Results  Component Value Date   HGBA1C 6.3 09/30/2017  ,stable overall by history and exam, recent data reviewed with pt, and pt to continue medical treatment as before,  to f/u any worsening symptoms or concerns

## 2017-09-30 NOTE — Assessment & Plan Note (Signed)

## 2017-09-30 NOTE — Patient Instructions (Addendum)
You had the flu shot today  Please continue all other medications as before, and refills have been done if requested.  Please have the pharmacy call with any other refills you may need.  Please continue your efforts at being more active, low cholesterol diet, and weight control.  You are otherwise up to date with prevention measures today.  Please keep your appointments with your specialists as you may have planned  Please go to the LAB in the Basement (turn left off the elevator) for the tests to be done today  You will be contacted by phone if any changes need to be made immediately.  Otherwise, you will receive a letter about your results with an explanation, but please check with MyChart first.  Please remember to sign up for MyChart if you have not done so, as this will be important to you in the future with finding out test results, communicating by private email, and scheduling acute appointments online when needed.  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

## 2017-10-01 ENCOUNTER — Telehealth: Payer: Self-pay

## 2017-10-01 LAB — HEPATITIS C ANTIBODY
HEP C AB: NONREACTIVE
SIGNAL TO CUT-OFF: 0.02 (ref <1.00)

## 2017-10-01 LAB — HIV ANTIBODY (ROUTINE TESTING W REFLEX): HIV: NONREACTIVE

## 2017-10-01 NOTE — Telephone Encounter (Signed)
Pt has been informed and expressed understanding.  

## 2017-10-01 NOTE — Telephone Encounter (Signed)
-----   Message from Corwin LevinsJames W John, MD sent at 09/30/2017  8:27 PM EST ----- Left message on MyChart, pt to cont same tx except  The test results show that your current treatment is OK.except the LDL cholesterol is very high.  As you stated at your visit that you are taking your statin daily, it does not appear to be working well.  We should increase the lipitor to 80 mg per day.  I will send a new prescription, and you should hear from the office    Peter Kent to please inform pt, I will do rx

## 2017-11-23 ENCOUNTER — Telehealth: Payer: Self-pay | Admitting: Internal Medicine

## 2017-11-23 MED ORDER — AMLODIPINE BESYLATE 10 MG PO TABS: 10.0000 mg | ORAL_TABLET | Freq: Every day | ORAL | 3 refills | Status: DC

## 2017-11-23 MED ORDER — OLMESARTAN MEDOXOMIL 40 MG PO TABS: 40.0000 mg | ORAL_TABLET | Freq: Every day | ORAL | 3 refills | Status: DC

## 2017-11-23 MED ORDER — HYDROCHLOROTHIAZIDE 25 MG PO TABS: 25.0000 mg | ORAL_TABLET | Freq: Every day | ORAL | 3 refills | Status: DC

## 2017-11-23 NOTE — Telephone Encounter (Signed)
Current BP combination med on backorder  Please let pt know - we changed to the 3 component meds - all sent to the pharmacy

## 2017-11-23 NOTE — Telephone Encounter (Signed)
Called pt, LVM to return call to discuss

## 2017-11-23 NOTE — Telephone Encounter (Signed)
Pt has been informed and expressed understanding.  

## 2017-12-01 ENCOUNTER — Telehealth: Payer: Self-pay | Admitting: Internal Medicine

## 2017-12-01 MED ORDER — TELMISARTAN 80 MG PO TABS: 80.0000 mg | ORAL_TABLET | Freq: Every day | ORAL | 3 refills | Status: DC

## 2017-12-01 NOTE — Telephone Encounter (Signed)
Ok for Peter Kent to let pt know -   We had to change his benicar to Micardis (very similar) as the benicar is on backorder and not available

## 2018-09-08 DIAGNOSIS — H524 Presbyopia: Secondary | ICD-10-CM | POA: Diagnosis not present

## 2018-10-01 ENCOUNTER — Encounter: Payer: 59 | Admitting: Internal Medicine

## 2018-10-12 ENCOUNTER — Encounter: Payer: Self-pay | Admitting: Internal Medicine

## 2018-10-12 ENCOUNTER — Other Ambulatory Visit (INDEPENDENT_AMBULATORY_CARE_PROVIDER_SITE_OTHER): Payer: 59

## 2018-10-12 ENCOUNTER — Ambulatory Visit (INDEPENDENT_AMBULATORY_CARE_PROVIDER_SITE_OTHER): Payer: 59 | Admitting: Internal Medicine

## 2018-10-12 VITALS — BP 162/100 | HR 88 | Temp 97.7°F | Ht 74.0 in | Wt 259.0 lb

## 2018-10-12 DIAGNOSIS — R7302 Impaired glucose tolerance (oral): Secondary | ICD-10-CM | POA: Diagnosis not present

## 2018-10-12 DIAGNOSIS — Z0000 Encounter for general adult medical examination without abnormal findings: Secondary | ICD-10-CM

## 2018-10-12 DIAGNOSIS — I10 Essential (primary) hypertension: Secondary | ICD-10-CM | POA: Diagnosis not present

## 2018-10-12 LAB — LIPID PANEL
Cholesterol: 247 mg/dL — ABNORMAL HIGH (ref 0–200)
HDL: 54.5 mg/dL (ref >39.00)
LDL Cholesterol: 166 mg/dL — ABNORMAL HIGH (ref 0–99)
NonHDL: 192.88
Total CHOL/HDL Ratio: 5
Triglycerides: 133 mg/dL (ref 0.0–149.0)
VLDL: 26.6 mg/dL (ref 0.0–40.0)

## 2018-10-12 LAB — BASIC METABOLIC PANEL
BUN: 11 mg/dL (ref 6–23)
CO2: 27 mEq/L (ref 19–32)
Calcium: 9.3 mg/dL (ref 8.4–10.5)
Chloride: 102 mEq/L (ref 96–112)
Creatinine, Ser: 1.05 mg/dL (ref 0.40–1.50)
GFR: 86.44 mL/min (ref >60.00)
Glucose, Bld: 82 mg/dL (ref 70–99)
Potassium: 3.9 mEq/L (ref 3.5–5.1)
Sodium: 137 mEq/L (ref 135–145)

## 2018-10-12 LAB — CBC WITH DIFFERENTIAL/PLATELET
Basophils Absolute: 0.1 10*3/uL (ref 0.0–0.1)
Basophils Relative: 0.9 % (ref 0.0–3.0)
Eosinophils Absolute: 0 10*3/uL (ref 0.0–0.7)
Eosinophils Relative: 0.4 % (ref 0.0–5.0)
HCT: 42.4 % (ref 39.0–52.0)
Hemoglobin: 14.3 g/dL (ref 13.0–17.0)
LYMPHS ABS: 2.4 10*3/uL (ref 0.7–4.0)
Lymphocytes Relative: 35.5 % (ref 12.0–46.0)
MCHC: 33.8 g/dL (ref 30.0–36.0)
MCV: 88.5 fl (ref 78.0–100.0)
MONO ABS: 0.6 10*3/uL (ref 0.1–1.0)
Monocytes Relative: 9.4 % (ref 3.0–12.0)
Neutro Abs: 3.6 10*3/uL (ref 1.4–7.7)
Neutrophils Relative %: 53.8 % (ref 43.0–77.0)
PLATELETS: 255 10*3/uL (ref 150.0–400.0)
RBC: 4.79 Mil/uL (ref 4.22–5.81)
RDW: 14.4 % (ref 11.5–15.5)
WBC: 6.6 10*3/uL (ref 4.0–10.5)

## 2018-10-12 LAB — URINALYSIS, ROUTINE W REFLEX MICROSCOPIC
Bilirubin Urine: NEGATIVE
Ketones, ur: NEGATIVE
Leukocytes,Ua: NEGATIVE
Nitrite: NEGATIVE
Specific Gravity, Urine: 1.015 (ref 1.000–1.030)
TOTAL PROTEIN, URINE-UPE24: NEGATIVE
URINE GLUCOSE: NEGATIVE
Urobilinogen, UA: 0.2 (ref 0.0–1.0)
pH: 6 (ref 5.0–8.0)

## 2018-10-12 LAB — PSA: PSA: 0.88 ng/mL (ref 0.10–4.00)

## 2018-10-12 LAB — HEPATIC FUNCTION PANEL
ALT: 15 U/L (ref 0–53)
AST: 18 U/L (ref 0–37)
Albumin: 4.6 g/dL (ref 3.5–5.2)
Alkaline Phosphatase: 92 U/L (ref 39–117)
Bilirubin, Direct: 0.1 mg/dL (ref 0.0–0.3)
Total Bilirubin: 0.7 mg/dL (ref 0.2–1.2)
Total Protein: 7.5 g/dL (ref 6.0–8.3)

## 2018-10-12 LAB — TSH: TSH: 2.11 u[IU]/mL (ref 0.35–4.50)

## 2018-10-12 LAB — HEMOGLOBIN A1C: Hgb A1c MFr Bld: 5.9 % (ref 4.6–6.5)

## 2018-10-12 MED ORDER — TELMISARTAN 80 MG PO TABS: 80.0000 mg | ORAL_TABLET | Freq: Every day | ORAL | 3 refills | Status: DC

## 2018-10-12 MED ORDER — LABETALOL HCL 200 MG PO TABS: 400.0000 mg | ORAL_TABLET | Freq: Two times a day (BID) | ORAL | 3 refills | Status: DC

## 2018-10-12 MED ORDER — SILDENAFIL CITRATE 100 MG PO TABS: 50.0000 mg | ORAL_TABLET | Freq: Every day | ORAL | 11 refills | Status: DC | PRN

## 2018-10-12 MED ORDER — AMLODIPINE BESYLATE 10 MG PO TABS: 10.0000 mg | ORAL_TABLET | Freq: Every day | ORAL | 3 refills | Status: DC

## 2018-10-12 MED ORDER — ATORVASTATIN CALCIUM 80 MG PO TABS: 80.0000 mg | ORAL_TABLET | Freq: Every day | ORAL | 3 refills | Status: DC

## 2018-10-12 NOTE — Progress Notes (Signed)
Subjective:    Patient ID: Peter Kent, male    DOB: 1955/11/12, 63 y.o.   MRN: [id number]  HPI Here for wellness and f/u;  Overall doing ok;  Pt denies Chest pain, worsening SOB, DOE, wheezing, orthopnea, PND, worsening LE edema, palpitations, dizziness or syncope.  Pt denies neurological change such as new headache, facial or extremity weakness.  Pt denies polydipsia, polyuria, or low sugar symptoms. Pt states overall good compliance with treatment and medications, good tolerability, and has been trying to follow appropriate diet.  Pt denies worsening depressive symptoms, suicidal ideation or panic. No fever, night sweats, wt loss, loss of appetite, or other constitutional symptoms.  Pt states good ability with ADL's, has low fall risk, home safety reviewed and adequate, no other significant changes in hearing or vision, and only occasionally active with exercise.  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  No other new complaints Past Medical History:  Diagnosis Date  . ERECTILE DYSFUNCTION 04/08/2007  . Full dentures   . GERD (gastroesophageal reflux disease)   . HEAD TRAUMA, CLOSED 04/08/2007  . HYPERCHOLESTEROLEMIA 04/08/2007  . HYPERLIPIDEMIA 04/11/2007  . HYPERTENSION 04/08/2007  . Impaired glucose tolerance 07/05/2013  . Morbid obesity (HCC) 04/08/2007  . Preventative health care 03/22/2011  . Wears glasses    Past Surgical History:  Procedure Laterality Date  . CIRCUMCISION    . INSERTION OF MESH N/A 10/19/2013   Procedure: INSERTION OF MESH;  Surgeon: Ernestene Mention, MD;  Location: [facility];  Service: General;  Laterality: N/A;  . MULTIPLE TOOTH EXTRACTIONS    . UMBILICAL HERNIA REPAIR N/A 10/19/2013   Procedure: UMBILICAL HERNIA REPAIR WITH MESH;  Surgeon: Ernestene Mention, MD;  Location: [facility];  Service: General;  Laterality: N/A;    reports that he has never smoked. He has never used smokeless tobacco. He reports that he does not drink alcohol or use drugs. family history includes Cancer in an other family member; Diabetes in an other family member; Heart disease in an other family member; Hypertension in an other family member. No Known Allergies Current Outpatient Medications on File Prior to Visit  Medication Sig Dispense Refill  . aspirin 81 MG tablet Take 81 mg by mouth daily.       No current facility-administered medications on file prior to visit.    Review of Systems  Constitutional: Negative for other unusual diaphoresis or sweats HENT: Negative for ear discharge or swelling Eyes: Negative for other worsening visual disturbances Respiratory: Negative for stridor or other swelling  Gastrointestinal: Negative for worsening distension or other blood Genitourinary: Negative for retention or other urinary change Musculoskeletal: Negative for other MSK pain or swelling Skin: Negative for color change or other new lesions Neurological: Negative for worsening tremors and other numbness  Psychiatric/Behavioral: Negative for worsening agitation or other fatigue All other system neg per pt    Objective:   Physical Exam BP (!) 162/100   Pulse 88   Temp 97.7 F (36.5 C) (Oral)   Ht 6\' 2"  (1.88 m)   Wt 259 lb (117.5 kg)   SpO2 97%   BMI 33.25 kg/m  VS noted,  Constitutional: Pt is oriented to person, place, and time. Appears well-developed and well-nourished, in no significant distress and comfortable Head: Normocephalic and atraumatic  Eyes: Conjunctivae and EOM are normal. Pupils are equal, round, and reactive to light Right Ear: External ear normal without discharge Left Ear: External ear normal without discharge Nose: Nose without discharge or  deformity Mouth/Throat: Oropharynx is without other ulcerations and moist  Neck: Normal range of motion. Neck supple. No JVD present. No tracheal deviation present or significant neck LA or mass Cardiovascular: Normal rate, regular rhythm, normal heart sounds and intact distal pulses.   Pulmonary/Chest: WOB normal and breath sounds without rales or wheezing  Abdominal: Soft. Bowel sounds are normal. NT. No HSM  Musculoskeletal: Normal range of motion. Exhibits no edema Lymphadenopathy: Has no other cervical adenopathy.  Neurological: Pt is alert and oriented to person, place, and time. Pt has normal reflexes. No cranial nerve deficit. Motor grossly intact, Gait intact Skin: Skin is warm and dry. No rash noted or new ulcerations Psychiatric:  Has normal mood and affect. Behavior is normal without agitation No other exam findings Lab Results  Component Value Date   WBC 6.6 10/12/2018   HGB 14.3 10/12/2018   HCT 42.4 10/12/2018   PLT 255.0 10/12/2018   GLUCOSE 82 10/12/2018   CHOL 247 (H) 10/12/2018   TRIG 133.0 10/12/2018   HDL 54.50 10/12/2018   LDLDIRECT 156.1 09/23/2007   LDLCALC 166 (H) 10/12/2018   ALT 15 10/12/2018   AST 18 10/12/2018   NA 137 10/12/2018   K 3.9 10/12/2018   CL 102 10/12/2018   CREATININE 1.05 10/12/2018   BUN 11 10/12/2018   CO2 27 10/12/2018   TSH 2.11 10/12/2018   PSA 0.88 10/12/2018   HGBA1C 5.9 10/12/2018        Assessment & Plan:

## 2018-10-12 NOTE — Assessment & Plan Note (Signed)
stable overall by history and exam, recent data reviewed with pt, and pt to continue medical treatment as before,  to f/u any worsening symptoms or concerns  

## 2018-10-12 NOTE — Patient Instructions (Addendum)
Please take all medications on your list today as prescribed  Please check your Blood Pressure on a regular basis with the goal being at least less than 140/90  Please continue all other medications as before, and refills have been done if requested.  Please have the pharmacy call with any other refills you may need.  Please continue your efforts at being more active, low cholesterol diet, and weight control.  You are otherwise up to date with prevention measures today.  Please keep your appointments with your specialists as you may have planned  Please go to the LAB in the Basement (turn left off the elevator) for the tests to be done today  You will be contacted by phone if any changes need to be made immediately.  Otherwise, you will receive a letter about your results with an explanation, but please check with MyChart first.  Please remember to sign up for MyChart if you have not done so, as this will be important to you in the future with finding out test results, communicating by private email, and scheduling acute appointments online when needed.  Please return in 6 months, or sooner if needed

## 2018-10-12 NOTE — Assessment & Plan Note (Signed)

## 2018-10-12 NOTE — Assessment & Plan Note (Signed)
Uncontrolled due to medical non compliance, urged to take all meds, o/w stable overall by history and exam, recent data reviewed with pt, and pt to continue medical treatment as before,  to f/u any worsening symptoms or concerns

## 2019-04-14 ENCOUNTER — Encounter: Payer: Self-pay | Admitting: Internal Medicine

## 2019-04-14 ENCOUNTER — Other Ambulatory Visit: Payer: Self-pay

## 2019-04-14 ENCOUNTER — Ambulatory Visit (INDEPENDENT_AMBULATORY_CARE_PROVIDER_SITE_OTHER): Payer: 59 | Admitting: Internal Medicine

## 2019-04-14 VITALS — BP 162/100 | HR 90 | Temp 98.4°F | Ht 74.0 in | Wt 271.0 lb

## 2019-04-14 DIAGNOSIS — E538 Deficiency of other specified B group vitamins: Secondary | ICD-10-CM

## 2019-04-14 DIAGNOSIS — R3129 Other microscopic hematuria: Secondary | ICD-10-CM | POA: Diagnosis not present

## 2019-04-14 DIAGNOSIS — I10 Essential (primary) hypertension: Secondary | ICD-10-CM

## 2019-04-14 DIAGNOSIS — E611 Iron deficiency: Secondary | ICD-10-CM

## 2019-04-14 DIAGNOSIS — E559 Vitamin D deficiency, unspecified: Secondary | ICD-10-CM

## 2019-04-14 DIAGNOSIS — R7302 Impaired glucose tolerance (oral): Secondary | ICD-10-CM | POA: Diagnosis not present

## 2019-04-14 DIAGNOSIS — Z0000 Encounter for general adult medical examination without abnormal findings: Secondary | ICD-10-CM

## 2019-04-14 DIAGNOSIS — E785 Hyperlipidemia, unspecified: Secondary | ICD-10-CM

## 2019-04-14 MED ORDER — AMLODIPINE BESYLATE 10 MG PO TABS: 10.0000 mg | ORAL_TABLET | Freq: Every day | ORAL | 3 refills | Status: DC

## 2019-04-14 MED ORDER — TELMISARTAN 80 MG PO TABS: 80.0000 mg | ORAL_TABLET | Freq: Every day | ORAL | 3 refills | Status: DC

## 2019-04-14 MED ORDER — ATORVASTATIN CALCIUM 80 MG PO TABS: 80.0000 mg | ORAL_TABLET | Freq: Every day | ORAL | 3 refills | Status: DC

## 2019-04-14 MED ORDER — LABETALOL HCL 200 MG PO TABS: 400.0000 mg | ORAL_TABLET | Freq: Two times a day (BID) | ORAL | 3 refills | Status: DC

## 2019-04-14 NOTE — Patient Instructions (Addendum)
Please take all medications as prescribed  You will be contacted regarding the referral for: Urology  Please continue all other medications as before, and refills have been done if requested.  Please have the pharmacy call with any other refills you may need.  Please continue your efforts at being more active, low cholesterol diet, and weight control.  You are otherwise up to date with prevention measures today.  Please keep your appointments with your specialists as you may have planned  Please return in 6 months, or sooner if needed, with Lab testing done 3-5 days before

## 2019-04-14 NOTE — Progress Notes (Addendum)
Subjective:    Patient ID: Peter Kent, male    DOB: 03/01/1956, 63 y.o.   MRN: [id number]  HPI  Here to f/u; overall doing ok,  Pt denies chest pain, increasing sob or doe, wheezing, orthopnea, PND, increased LE swelling, palpitations, dizziness or syncope.  Pt denies new neurological symptoms such as new headache, or facial or extremity weakness or numbness.  Pt denies polydipsia, polyuria, or low sugar episode.  Pt states overall good compliance with meds, mostly trying to follow appropriate diet, with wt overall stable,  but little exercise however.  Has gained wt with the pandemic.   Wt Readings from Last 3 Encounters:  04/14/19 271 lb (122.9 kg)  10/12/18 259 lb (117.5 kg)  09/30/17 267 lb (121.1 kg)  not sure why, but now realizes not taking the labetolol and the micardis, though thinks he has the bottles at home.   Denies urinary symptoms such as dysuria, frequency, urgency, flank pain, hematuria or n/v, fever, chills. Past Medical History:  Diagnosis Date  . ERECTILE DYSFUNCTION 04/08/2007  . Full dentures   . GERD (gastroesophageal reflux disease)   . HEAD TRAUMA, CLOSED 04/08/2007  . HYPERCHOLESTEROLEMIA 04/08/2007  . HYPERLIPIDEMIA 04/11/2007  . HYPERTENSION 04/08/2007  . Impaired glucose tolerance 07/05/2013  . Morbid obesity ([place]) 04/08/2007  . Preventative health care 03/22/2011  . Wears glasses    Past Surgical History:  Procedure Laterality Date  . CIRCUMCISION    . INSERTION OF MESH N/A 10/19/2013   Procedure: INSERTION OF MESH;  Surgeon: Adin Hector, MD;  Location: [facility];  Service: General;  Laterality: N/A;  . MULTIPLE TOOTH EXTRACTIONS    . UMBILICAL HERNIA REPAIR N/A 10/19/2013   Procedure: UMBILICAL HERNIA REPAIR WITH MESH;  Surgeon: Adin Hector, MD;  Location: [facility];  Service: General;  Laterality: N/A;    reports that he has never smoked. He has never used smokeless tobacco. He reports that he does not drink  alcohol or use drugs. family history includes Cancer in an other family member; Diabetes in an other family member; Heart disease in an other family member; Hypertension in an other family member. No Known Allergies Current Outpatient Medications on File Prior to Visit  Medication Sig Dispense Refill  . aspirin 81 MG tablet Take 81 mg by mouth daily.      . sildenafil (VIAGRA) 100 MG tablet Take 0.5-1 tablets (50-100 mg total) by mouth daily as needed for erectile dysfunction. 5 tablet 11   No current facility-administered medications on file prior to visit.    Review of Systems  Constitutional: Negative for other unusual diaphoresis or sweats HENT: Negative for ear discharge or swelling Eyes: Negative for other worsening visual disturbances Respiratory: Negative for stridor or other swelling  Gastrointestinal: Negative for worsening distension or other blood Genitourinary: Negative for retention or other urinary change Musculoskeletal: Negative for other MSK pain or swelling Skin: Negative for color change or other new lesions Neurological: Negative for worsening tremors and other numbness  Psychiatric/Behavioral: Negative for worsening agitation or other fatigue All other system neg per pt    Objective:   Physical Exam BP (!) 162/100   Pulse 90   Temp 98.4 F (36.9 C) (Oral)   Ht 6\' 2"  (1.88 m)   Wt 271 lb (122.9 kg)   SpO2 98%   BMI 34.79 kg/m  VS noted,  Constitutional: Pt appears in NAD HENT: Head: NCAT.  Right Ear: External ear normal.  Left Ear: External ear normal.  Eyes: . Pupils are equal, round, and reactive to light. Conjunctivae and EOM are normal Nose: without d/c or deformity Neck: Neck supple. Gross normal ROM Cardiovascular: Normal rate and regular rhythm.   Pulmonary/Chest: Effort normal and breath sounds without rales or wheezing.  Abd:  Soft, NT, ND, + BS, no organomegaly Neurological: Pt is alert. At baseline orientation, motor grossly intact Skin:  Skin is warm. No rashes, other new lesions, no LE edema Psychiatric: Pt behavior is normal without agitation  No other exam findings Lab Results  Component Value Date   WBC 6.6 10/12/2018   HGB 14.3 10/12/2018   HCT 42.4 10/12/2018   PLT 255.0 10/12/2018   GLUCOSE 82 10/12/2018   CHOL 247 (H) 10/12/2018   TRIG 133.0 10/12/2018   HDL 54.50 10/12/2018   LDLDIRECT 156.1 09/23/2007   LDLCALC 166 (H) 10/12/2018   ALT 15 10/12/2018   AST 18 10/12/2018   NA 137 10/12/2018   K 3.9 10/12/2018   CL 102 10/12/2018   CREATININE 1.05 10/12/2018   BUN 11 10/12/2018   CO2 27 10/12/2018   TSH 2.11 10/12/2018   PSA 0.88 10/12/2018   HGBA1C 5.9 10/12/2018        Assessment & Plan:

## 2019-04-15 ENCOUNTER — Encounter: Payer: Self-pay | Admitting: Internal Medicine

## 2019-04-15 NOTE — Assessment & Plan Note (Addendum)
stable overall by history and exam, recent data reviewed with pt, and pt to restart the statin,  to f/u any worsening symptoms or concerns

## 2019-04-15 NOTE — Assessment & Plan Note (Signed)
Asympt, refer to urology for further consideration

## 2019-04-15 NOTE — Assessment & Plan Note (Signed)
stable overall by history and exam, recent data reviewed with pt, and pt to continue medical treatment as before,  to f/u any worsening symptoms or concerns  

## 2019-04-15 NOTE — Assessment & Plan Note (Addendum)
stable overall by history and exam, recent data reviewed with pt, and pt to restart meds,  to f/u any worsening symptoms or concerns

## 2019-07-04 ENCOUNTER — Other Ambulatory Visit: Payer: Self-pay

## 2019-07-04 ENCOUNTER — Emergency Department (HOSPITAL_COMMUNITY): Payer: 59

## 2019-07-04 ENCOUNTER — Observation Stay (HOSPITAL_COMMUNITY)
Admission: EM | Admit: 2019-07-04 | Discharge: 2019-07-05 | Disposition: A | Discharge status: Home / Self Care | Payer: 59 | Attending: Internal Medicine | Admitting: Internal Medicine

## 2019-07-04 DIAGNOSIS — G454 Transient global amnesia: Secondary | ICD-10-CM | POA: Diagnosis not present

## 2019-07-04 DIAGNOSIS — Z20828 Contact with and (suspected) exposure to other viral communicable diseases: Secondary | ICD-10-CM | POA: Diagnosis not present

## 2019-07-04 DIAGNOSIS — Z79899 Other long term (current) drug therapy: Secondary | ICD-10-CM | POA: Insufficient documentation

## 2019-07-04 DIAGNOSIS — E785 Hyperlipidemia, unspecified: Secondary | ICD-10-CM | POA: Insufficient documentation

## 2019-07-04 DIAGNOSIS — K219 Gastro-esophageal reflux disease without esophagitis: Secondary | ICD-10-CM | POA: Insufficient documentation

## 2019-07-04 DIAGNOSIS — I160 Hypertensive urgency: Secondary | ICD-10-CM

## 2019-07-04 DIAGNOSIS — Z7982 Long term (current) use of aspirin: Secondary | ICD-10-CM | POA: Insufficient documentation

## 2019-07-04 DIAGNOSIS — Z23 Encounter for immunization: Secondary | ICD-10-CM | POA: Diagnosis not present

## 2019-07-04 DIAGNOSIS — N529 Male erectile dysfunction, unspecified: Secondary | ICD-10-CM | POA: Diagnosis not present

## 2019-07-04 DIAGNOSIS — R4182 Altered mental status, unspecified: Principal | ICD-10-CM | POA: Insufficient documentation

## 2019-07-04 LAB — CBC WITH DIFFERENTIAL/PLATELET
Abs Immature Granulocytes: 0.02 10*3/uL (ref 0.00–0.07)
Basophils Absolute: 0 10*3/uL (ref 0.0–0.1)
Basophils Relative: 0 %
Eosinophils Absolute: 0 10*3/uL (ref 0.0–0.5)
Eosinophils Relative: 0 %
HCT: 43.5 % (ref 39.0–52.0)
Hemoglobin: 14.4 g/dL (ref 13.0–17.0)
Immature Granulocytes: 0 %
Lymphocytes Relative: 19 %
Lymphs Abs: 1.3 10*3/uL (ref 0.7–4.0)
MCH: 29.9 pg (ref 26.0–34.0)
MCHC: 33.1 g/dL (ref 30.0–36.0)
MCV: 90.4 fL (ref 80.0–100.0)
Monocytes Absolute: 0.6 10*3/uL (ref 0.1–1.0)
Monocytes Relative: 8 %
Neutro Abs: 5.1 10*3/uL (ref 1.7–7.7)
Neutrophils Relative %: 73 %
Platelets: 255 10*3/uL (ref 150–400)
RBC: 4.81 MIL/uL (ref 4.22–5.81)
RDW: 13.9 % (ref 11.5–15.5)
WBC: 7 10*3/uL (ref 4.0–10.5)
nRBC: 0 % (ref 0.0–0.2)

## 2019-07-04 LAB — BASIC METABOLIC PANEL
Anion gap: 8 (ref 5–15)
BUN: 12 mg/dL (ref 8–23)
CO2: 26 mmol/L (ref 22–32)
Calcium: 9.3 mg/dL (ref 8.9–10.3)
Chloride: 105 mmol/L (ref 98–111)
Creatinine, Ser: 1.09 mg/dL (ref 0.61–1.24)
GFR calc Af Amer: >60 mL/min (ref >60)
GFR calc non Af Amer: >60 mL/min (ref >60)
Glucose, Bld: 95 mg/dL (ref 70–99)
Potassium: 3.8 mmol/L (ref 3.5–5.1)
Sodium: 139 mmol/L (ref 135–145)

## 2019-07-04 LAB — ETHANOL: Alcohol, Ethyl (B): <10 mg/dL (ref <10)

## 2019-07-04 NOTE — ED Triage Notes (Signed)
BIB EMS. From home, wife called for altered mental status. States pt was driving earlier today and became confused. EMS reports pt does not remember this morning, can only recall events since about 1600 today. BP 220/112 on EMS arrival. Pt has no complaints at this time

## 2019-07-04 NOTE — ED Provider Notes (Signed)
[facility]ENC[facility] Provider Note   CSN: [id number] Arrival date & time: 07/04/19  1855     History   Chief Complaint Chief Complaint  Patient presents with  . Altered Mental Status    HPI Peter Kent is a 63 y.o. male.     HPI   63 year old male with confusion.  Noted by his wife around 4:30 PM today.  Patient was driving in his car and needed redirected multiple times as to what him and his wife are doing.  After he is reoriented he would asked the same question again a few minutes later.  He does not remember this.  He states that he remembers waking up this morning but rest of the vents throughout the day or like "I was in even there."  His memory beyond this morning is intact.  He has no acute complaints otherwise.  Per his wife, no slurred speech.  Has not seemed off balance.  Patient denies any acute pain.  No visual complaints.  Past Medical History:  Diagnosis Date  . ERECTILE DYSFUNCTION 04/08/2007  . Full dentures   . GERD (gastroesophageal reflux disease)   . HEAD TRAUMA, CLOSED 04/08/2007  . HYPERCHOLESTEROLEMIA 04/08/2007  . HYPERLIPIDEMIA 04/11/2007  . HYPERTENSION 04/08/2007  . Impaired glucose tolerance 07/05/2013  . Morbid obesity ([facility]) 04/08/2007  . Preventative health care 03/22/2011  . Wears glasses     Patient Active Problem List   Diagnosis Date Noted  . Microhematuria 02/28/2015  . Lower back pain 08/16/2014  . Umbilical hernia 09/20/2013  . Impaired glucose tolerance 07/05/2013  . Dyspepsia 05/17/2013  . Preventative health care 03/22/2011  . Hyperlipidemia 04/11/2007  . Morbid obesity ([facility]) 04/08/2007  . ERECTILE DYSFUNCTION 04/08/2007  . Essential hypertension 04/08/2007  . HEAD TRAUMA, CLOSED 04/08/2007    Past Surgical History:  Procedure Laterality Date  . CIRCUMCISION    . INSERTION OF MESH N/A 10/19/2013   Procedure: INSERTION OF MESH;  Surgeon: Ernestene MentionHaywood M Ingram, MD;  Location: [facility];  Service: General;  Laterality: N/A;  . MULTIPLE TOOTH EXTRACTIONS    . UMBILICAL HERNIA REPAIR N/A 10/19/2013   Procedure: UMBILICAL HERNIA REPAIR WITH MESH;  Surgeon: Ernestene MentionHaywood M Ingram, MD;  Location: [facility];  Service: General;  Laterality: N/A;        Home Medications    Prior to Admission medications   Medication Sig Start Date End Date Taking? Authorizing Provider  amLODipine (NORVASC) 10 MG tablet Take 1 tablet (10 mg total) by mouth daily. 04/14/19   Corwin LevinsJohn, James W, MD  aspirin 81 MG tablet Take 81 mg by mouth daily.      [provider]  atorvastatin (LIPITOR) 80 MG tablet Take 1 tablet (80 mg total) by mouth daily. 04/14/19   Corwin LevinsJohn, James W, MD  labetalol (NORMODYNE) 200 MG tablet Take 2 tablets (400 mg total) by mouth 2 (two) times daily. 04/14/19   Corwin LevinsJohn, James W, MD  sildenafil (VIAGRA) 100 MG tablet Take 0.5-1 tablets (50-100 mg total) by mouth daily as needed for erectile dysfunction. 10/12/18   Corwin LevinsJohn, James W, MD  telmisartan (MICARDIS) 80 MG tablet Take 1 tablet (80 mg total) by mouth daily. 04/14/19   Corwin LevinsJohn, James W, MD    Family History Family History  Problem Relation Age of Onset  . Hypertension Other   . Heart disease Other   . Diabetes Other   . Cancer Other  breast cancer  . Colon cancer Neg Hx     Social History Social History   Tobacco Use  . Smoking status: Never Smoker  . Smokeless tobacco: Never Used  Substance Use Topics  . Alcohol use: No  . Drug use: No     Allergies   Patient has no known allergies.   Review of Systems Review of Systems  All systems reviewed and negative, other than as noted in HPI.  Physical Exam Updated Vital Signs BP (!) 198/92   Pulse (!) 58   Temp 97.9 F (36.6 C) (Oral)   Resp 17   Ht 6\' 2"  (1.88 m)   Wt 119.7 kg   SpO2 98%   BMI 33.90 kg/m   Physical Exam Vitals signs and nursing note reviewed.  Constitutional:      General: He is not in acute distress.    Appearance:  He is well-developed.  HENT:     Head: Normocephalic and atraumatic.  Eyes:     General:        Right eye: No discharge.        Left eye: No discharge.     Conjunctiva/sclera: Conjunctivae normal.  Neck:     Musculoskeletal: Neck supple.  Cardiovascular:     Rate and Rhythm: Normal rate and regular rhythm.     Heart sounds: Normal heart sounds. No murmur. No friction rub. No gallop.   Pulmonary:     Effort: Pulmonary effort is normal. No respiratory distress.     Breath sounds: Normal breath sounds.  Abdominal:     General: There is no distension.     Palpations: Abdomen is soft.     Tenderness: There is no abdominal tenderness.  Musculoskeletal:        General: No tenderness.  Skin:    General: Skin is warm and dry.  Neurological:     General: No focal deficit present.     Mental Status: He is alert and oriented to person, place, and time.     Cranial Nerves: No cranial nerve deficit.     Sensory: No sensory deficit.     Motor: No weakness.     Coordination: Coordination normal.  Psychiatric:        Behavior: Behavior normal.        Thought Content: Thought content normal.      ED Treatments / Results  Labs (all labs ordered are listed, but only abnormal results are displayed) Labs Reviewed  URINALYSIS, ROUTINE W REFLEX MICROSCOPIC - Abnormal; Notable for the following components:      Result Value   Color, Urine COLORLESS (*)    Specific Gravity, Urine 1.003 (*)    Hgb urine dipstick SMALL (*)    All other components within normal limits  SARS CORONAVIRUS 2 (TAT 6-24 HRS)  BASIC METABOLIC PANEL  CBC WITH DIFFERENTIAL/PLATELET  ETHANOL  HIV ANTIBODY (ROUTINE TESTING W REFLEX)  RAPID URINE DRUG SCREEN, HOSP PERFORMED  TSH  AMMONIA    EKG EKG Interpretation  Date/Time:  Monday July 04 2019 18:57:01 EST Ventricular Rate:  78 PR Interval:    QRS Duration: 90 QT Interval:  365 QTC Calculation: 416 R Axis:   2 Text Interpretation: Sinus rhythm  Borderline T wave abnormalities Confirmed by Virgel Manifold [phone]) on 07/04/2019 8:11:36 PM   Radiology Ct Head Wo Contrast  Result Date: 07/04/2019 CLINICAL DATA:  Transient global amnesia. Altered mental status. EXAM: CT HEAD WITHOUT CONTRAST TECHNIQUE: Contiguous axial images were obtained from  the base of the skull through the vertex without intravenous contrast. COMPARISON:  None. FINDINGS: Brain: No evidence of acute infarction, hemorrhage, hydrocephalus, extra-axial collection or mass lesion/mass effect. Diffuse mild cerebral cortical atrophy. Vascular: No hyperdense vessel or unexpected calcification. Skull: Normal. Negative for fracture or focal lesion. Sinuses/Orbits: Normal. Other: None IMPRESSION: No acute abnormalities. Diffuse mild cerebral cortical atrophy. Electronically Signed   By: Francene Boyers M.D.   On: 07/04/2019 21:16    Procedures Procedures (including critical care time)  Medications Ordered in ED Medications - No data to display   Initial Impression / Assessment and Plan / ED Course  I have reviewed the triage vital signs and the nursing notes.  Pertinent labs & imaging results that were available during my care of the patient were reviewed by me and considered in my medical decision making (see chart for details).        63 year old male with what I suspect may be transient global amnesia?.  Neuro exam is nonfocal.  CT the head without acute abnormality.  Neurology was consulted.  Disposition per the recommendations.  Final Clinical Impressions(s) / ED Diagnoses   Final diagnoses:  Transient global amnesia    ED Discharge Orders    None       Raeford Razor, MD 07/06/19 [phone]

## 2019-07-05 ENCOUNTER — Encounter (HOSPITAL_COMMUNITY): Payer: Self-pay | Admitting: Internal Medicine

## 2019-07-05 ENCOUNTER — Observation Stay (HOSPITAL_COMMUNITY): Payer: 59

## 2019-07-05 ENCOUNTER — Emergency Department (HOSPITAL_COMMUNITY): Payer: 59

## 2019-07-05 DIAGNOSIS — I160 Hypertensive urgency: Secondary | ICD-10-CM

## 2019-07-05 DIAGNOSIS — R4182 Altered mental status, unspecified: Secondary | ICD-10-CM

## 2019-07-05 DIAGNOSIS — G454 Transient global amnesia: Secondary | ICD-10-CM | POA: Diagnosis present

## 2019-07-05 LAB — URINALYSIS, ROUTINE W REFLEX MICROSCOPIC
Bacteria, UA: NONE SEEN
Bilirubin Urine: NEGATIVE
Glucose, UA: NEGATIVE mg/dL
Ketones, ur: NEGATIVE mg/dL
Leukocytes,Ua: NEGATIVE
Nitrite: NEGATIVE
Protein, ur: NEGATIVE mg/dL
Specific Gravity, Urine: 1.003 — ABNORMAL LOW (ref 1.005–1.030)
pH: 7 (ref 5.0–8.0)

## 2019-07-05 LAB — RAPID URINE DRUG SCREEN, HOSP PERFORMED
Amphetamines: NOT DETECTED
Barbiturates: NOT DETECTED
Benzodiazepines: NOT DETECTED
Cocaine: NOT DETECTED
Opiates: NOT DETECTED
Tetrahydrocannabinol: NOT DETECTED

## 2019-07-05 LAB — TSH: TSH: 0.914 u[IU]/mL (ref 0.350–4.500)

## 2019-07-05 LAB — AMMONIA: Ammonia: 30 umol/L (ref 9–35)

## 2019-07-05 LAB — HIV ANTIBODY (ROUTINE TESTING W REFLEX): HIV Screen 4th Generation wRfx: NONREACTIVE

## 2019-07-05 LAB — SARS CORONAVIRUS 2 (TAT 6-24 HRS): SARS Coronavirus 2: NEGATIVE

## 2019-07-05 MED ORDER — HYDRALAZINE HCL 20 MG/ML IJ SOLN: 10.0000 mg | INTRAMUSCULAR | Status: DC | PRN

## 2019-07-05 MED ORDER — ACETAMINOPHEN 650 MG RE SUPP: 650.0000 mg | Freq: Four times a day (QID) | RECTAL | Status: DC | PRN

## 2019-07-05 MED ORDER — ENOXAPARIN SODIUM 40 MG/0.4ML ~~LOC~~ SOLN
40.0000 mg | SUBCUTANEOUS | Status: DC
  Administered 2019-07-05: 09:00:00 40 mg via SUBCUTANEOUS
  Filled 2019-07-05: qty 0.4

## 2019-07-05 MED ORDER — LABETALOL HCL 200 MG PO TABS
400.0000 mg | ORAL_TABLET | Freq: Two times a day (BID) | ORAL | Status: DC
  Administered 2019-07-05: 400 mg via ORAL
  Filled 2019-07-05: qty 2

## 2019-07-05 MED ORDER — INFLUENZA VAC SPLIT QUAD 0.5 ML IM SUSY: 0.5000 mL | PREFILLED_SYRINGE | INTRAMUSCULAR | Status: DC

## 2019-07-05 MED ORDER — ACETAMINOPHEN 325 MG PO TABS: 650.0000 mg | ORAL_TABLET | Freq: Four times a day (QID) | ORAL | Status: DC | PRN

## 2019-07-05 MED ORDER — IRBESARTAN 300 MG PO TABS
300.0000 mg | ORAL_TABLET | Freq: Every day | ORAL | Status: DC
  Administered 2019-07-05: 300 mg via ORAL
  Filled 2019-07-05: qty 1

## 2019-07-05 MED ORDER — LORAZEPAM 2 MG/ML IJ SOLN
1.0000 mg | Freq: Once | INTRAMUSCULAR | Status: AC
  Administered 2019-07-05: 1 mg via INTRAVENOUS
  Filled 2019-07-05: qty 1

## 2019-07-05 MED ORDER — INFLUENZA VAC SPLIT QUAD 0.5 ML IM SUSY
0.5000 mL | PREFILLED_SYRINGE | Freq: Once | INTRAMUSCULAR | Status: AC
  Administered 2019-07-05: 0.5 mL via INTRAMUSCULAR

## 2019-07-05 MED ORDER — AMLODIPINE BESYLATE 10 MG PO TABS
10.0000 mg | ORAL_TABLET | Freq: Every day | ORAL | Status: DC
  Administered 2019-07-05: 10 mg via ORAL
  Filled 2019-07-05: qty 1

## 2019-07-05 NOTE — TOC Transition Note (Signed)
Transition of Care [facility]) - CM/SW Discharge Note   Patient Details  Name: Peter Kent MRN: [id number] Date of Birth: [birth year]  [facility] CM/SW Contact:  Pollie Friar, RN Phone Number: 07/05/2019, 4:19 PM   Clinical Narrative:    Pt discharging home with self care. Pt has hospital f/u. No needs per CM.   Final next level of care: Home/Self Care Barriers to Discharge: No Barriers Identified   Patient Goals and CMS Choice        Discharge Placement                       Discharge Plan and Services                                     Social Determinants of Health (SDOH) Interventions     Readmission Risk Interventions No flowsheet data found.

## 2019-07-05 NOTE — ED Provider Notes (Signed)
Patient signed out to me by Dr. Wilson Singer to follow-up on MRI.  Patient with transient alteration of his awareness earlier today.  Patient went to radiology for MRI but could not complete the study because of claustrophobia.  I did have an extended conversation with the patient he does not think he could do the procedure now but might try it again later.  Appreciate neurology consultation.  Patient to be admitted by hospitalist service, had EEG and repeat attempt for MRI.   Orpah Greek, MD 07/05/19 [phone]

## 2019-07-05 NOTE — ED Notes (Signed)
Breakfast tray ordered 

## 2019-07-05 NOTE — Progress Notes (Signed)
EEG complete - results pending 

## 2019-07-05 NOTE — Procedures (Signed)
Patient Name: Peter Kent  MRN: [id number]  Epilepsy Attending: Lora Havens  Referring Physician/Provider: Dr. Shela Leff Date: 07/05/2019 Duration: 27.58 minutes  Patient history: 63 year old male with transient alteration of awareness.  EEG to evaluate for seizures.  Level of alertness: Awake, asleep  AEDs during EEG study: None  Technical aspects: This EEG study was done with scalp electrodes positioned according to the 10-20 International system of electrode placement. Electrical activity was acquired at a sampling rate of 500Hz  and reviewed with a high frequency filter of 70Hz  and a low frequency filter of 1Hz . EEG data were recorded continuously and digitally stored.   Description: During awake state, the posterior dominant rhythm consists of 10-11 Hz activity of moderate voltage (25-35 uV) seen predominantly in posterior head regions, symmetric and reactive to eye opening and eye closing.  Sleep was characterized by vertex waves, sleep spindles (12 to 14 Hz), maximal frontocentral.  Physiologic photic driving was seen during photic stimulation.  No EEG change was seen during hyperventilation.         IMPRESSION: This study is within normal limits. No seizures or epileptiform discharges were seen throughout the recording.  Peter Kent

## 2019-07-05 NOTE — Consult Note (Signed)
Requesting Physician: Dr. Betsey Holiday    Chief Complaint: Confusion, memory loss  History obtained from: Patient and Chart    HPI:                                                                                                                                       Peter Kent is a 63 y.o. male with past medical history significant for hypertension, hyperlipidemia, remote history of head trauma, obesity presents to the emergency department after his wife brought him in for confusion and memory loss she noticed at 4:30 PM.   Wife states that he was fine until they were driving to the department store around 4:30PM when he kept asking the same question of where they were going and why they were going despite her repeatedly answering the question.  He also appeared to be confused and not able to remember recent events.  He is able to remember things before today morning.  Patient was brought into the emergency department.  The wife states that his blood pressure was 008 systolic initially.  Work-up in the emergency department included metabolic work-up which was mostly negative.  EtOH level less than 10, electrolytes within normal limits.  Urine drug screen negative.  Ammonia level pending.  CT head was unremarkable.  MRI brain was attempted however patient could not tolerate.     Past Medical History:  Diagnosis Date  . ERECTILE DYSFUNCTION 04/08/2007  . Full dentures   . GERD (gastroesophageal reflux disease)   . HEAD TRAUMA, CLOSED 04/08/2007  . HYPERCHOLESTEROLEMIA 04/08/2007  . HYPERLIPIDEMIA 04/11/2007  . HYPERTENSION 04/08/2007  . Impaired glucose tolerance 07/05/2013  . Morbid obesity ([place]) 04/08/2007  . Preventative health care 03/22/2011  . Wears glasses     Past Surgical History:  Procedure Laterality Date  . CIRCUMCISION    . INSERTION OF MESH N/A 10/19/2013   Procedure: INSERTION OF MESH;  Surgeon: Adin Hector, MD;  Location: [facility];  Service: General;   Laterality: N/A;  . MULTIPLE TOOTH EXTRACTIONS    . UMBILICAL HERNIA REPAIR N/A 10/19/2013   Procedure: UMBILICAL HERNIA REPAIR WITH MESH;  Surgeon: Adin Hector, MD;  Location: [facility];  Service: General;  Laterality: N/A;    Family History  Problem Relation Age of Onset  . Hypertension Other   . Heart disease Other   . Diabetes Other   . Cancer Other        breast cancer  . Colon cancer Neg Hx    Social History:  reports that he has never smoked. He has never used smokeless tobacco. He reports that he does not drink alcohol or use drugs.  Allergies: No Known Allergies  Medications:  I reviewed home medications   ROS:                                                                                                                                     14 systems reviewed and negative except above    Examination:                                                                                                      General: Appears well-developed and well-nourished.  Psych: Affect appropriate to situation Eyes: No scleral injection HENT: No OP obstrucion Head: Normocephalic.  Cardiovascular: Normal rate and regular rhythm.  Respiratory: Effort normal and breath sounds normal to anterior ascultation GI: Soft.  No distension. There is no tenderness.  Skin: WDI    Neurological Examination Mental Status: Alert, oriented, thought content appropriate, with slight hesitancy and some answers but was eventually able to correctly state month.  Speech fluent without evidence of aphasia. Able to follow 3 step commands without difficulty. Cranial Nerves: II: Visual fields grossly normal,  III,IV, VI: ptosis not present, extra-ocular motions intact bilaterally, pupils equal, round, reactive to light and accommodation V,VII: smile symmetric, facial light  touch sensation normal bilaterally VIII: hearing normal bilaterally IX,X: uvula rises symmetrically XI: bilateral shoulder shrug XII: midline tongue extension Motor: Right : Upper extremity   5/5    Left:     Upper extremity   5/5  Lower extremity   5/5     Lower extremity   5/5 Tone and bulk:normal tone throughout; no atrophy noted Sensory: Pinprick and light touch intact throughout, bilaterally Deep Tendon Reflexes: 2+ and symmetric throughout Plantars: Right: downgoing   Left: downgoing Cerebellar: normal finger-to-nose, normal rapid alternating movements and normal heel-to-shin test Gait: normal gait and station     Lab Results: Basic Metabolic Panel: Recent Labs  Lab 07/04/19 2124  NA 139  K 3.8  CL 105  CO2 26  GLUCOSE 95  BUN 12  CREATININE 1.09  CALCIUM 9.3    CBC: Recent Labs  Lab 07/04/19 2124  WBC 7.0  NEUTROABS 5.1  HGB 14.4  HCT 43.5  MCV 90.4  PLT 255    Coagulation Studies: No results for input(s): LABPROT, INR in the last 72 hours.  Imaging: Ct Head Wo Contrast  Result Date: 07/04/2019 CLINICAL DATA:  Transient global amnesia. Altered mental status. EXAM: CT HEAD WITHOUT CONTRAST TECHNIQUE: Contiguous axial images were obtained from the base of the skull through the vertex without intravenous  contrast. COMPARISON:  None. FINDINGS: Brain: No evidence of acute infarction, hemorrhage, hydrocephalus, extra-axial collection or mass lesion/mass effect. Diffuse mild cerebral cortical atrophy. Vascular: No hyperdense vessel or unexpected calcification. Skull: Normal. Negative for fracture or focal lesion. Sinuses/Orbits: Normal. Other: None IMPRESSION: No acute abnormalities. Diffuse mild cerebral cortical atrophy. Electronically Signed   By: Francene BoyersJames  Maxwell M.D.   On: 07/04/2019 21:16     I have reviewed the above imaging:CT Head    ASSESSMENT AND PLAN   Transient global amnesia versus hypertensive emergency -MRI brain without contrast to r/o  stroke, give Ativan 1 mg as well as Benadryl prior to MRI. -Blood pressure goal less than 160 systolic -Routine EEG in the morning  We will hold off further stroke work-up based on MRI brain results.  Neurology will continue to follow  Sushanth Aroor [facility] Pager Number [phone]

## 2019-07-05 NOTE — H&P (Signed)
History and Physical    Peter Kent:[id number] DOB: 1956/06/28 DOA: 07/04/2019  PCP: Biagio Borg, MD Patient coming from: Home  Chief Complaint: Altered mental status  HPI: Peter Kent is a 63 y.o. male with medical history significant of hypertension, hyperlipidemia, GERD presenting to the ED via EMS for evaluation of altered mental status.  History provided by patient and wife at bedside.  Wife states she visited her parents house with the patient and the left at 4:30 PM.  Patient was driving the car and appeared confused.  He asked his wife where they were going.  She told him they were going to [place] [facility] but a little bit later he asked the same question again.  She again had to explain to him where they were going.  He was confused and was asking why they were going there.  Wife did not notice any slurring of speech or drooping of his face.  No focal weakness.  No seizure-like activity noted and no prior history of seizures.  No prior history of stroke.  States she did baking in the morning but patient could not remember that.  She asked him some more questions and he could not remember any events prior to 4:30 PM.  He could not remember that they had just left her parents house.  Patient denies ethanol or drug use.  Denies headaches, neck stiffness, weakness, or numbness.  No recent illness.  Denies fevers or chills.  States he normally takes both amlodipine and labetalol for high blood pressure but did not take labetalol yesterday.  ED Course: Blood pressure 220/112 upon EMS arrival.  Afebrile.  CBC and BMP unremarkable.  Blood ethanol level undetectable.  Head CT negative for acute intracranial abnormality.  Neurology consulted.  Recommended MRI and EEG.  If patient unable to get MRI tonight due to claustrophobia, may need MRI under general anesthesia in the morning.  Review of Systems:  All systems reviewed and apart from history of presenting illness, are  negative.  Past Medical History:  Diagnosis Date  . ERECTILE DYSFUNCTION 04/08/2007  . Full dentures   . GERD (gastroesophageal reflux disease)   . HEAD TRAUMA, CLOSED 04/08/2007  . HYPERCHOLESTEROLEMIA 04/08/2007  . HYPERLIPIDEMIA 04/11/2007  . HYPERTENSION 04/08/2007  . Impaired glucose tolerance 07/05/2013  . Morbid obesity ([place]) 04/08/2007  . Preventative health care 03/22/2011  . Wears glasses     Past Surgical History:  Procedure Laterality Date  . CIRCUMCISION    . INSERTION OF MESH N/A 10/19/2013   Procedure: INSERTION OF MESH;  Surgeon: Adin Hector, MD;  Location: [place];  Service: General;  Laterality: N/A;  . MULTIPLE TOOTH EXTRACTIONS    . UMBILICAL HERNIA REPAIR N/A 10/19/2013   Procedure: UMBILICAL HERNIA REPAIR WITH MESH;  Surgeon: Adin Hector, MD;  Location: [place];  Service: General;  Laterality: N/A;     reports that he has never smoked. He has never used smokeless tobacco. He reports that he does not drink alcohol or use drugs.  No Known Allergies  Family History  Problem Relation Age of Onset  . Hypertension Other   . Heart disease Other   . Diabetes Other   . Cancer Other        breast cancer  . Colon cancer Neg Hx     Prior to Admission medications   Medication Sig Start Date End Date Taking? Authorizing Provider  amLODipine (NORVASC) 10 MG tablet Take  1 tablet (10 mg total) by mouth daily. 04/14/19   Corwin LevinsJohn, James W, MD  aspirin 81 MG tablet Take 81 mg by mouth daily.      [provider]  atorvastatin (LIPITOR) 80 MG tablet Take 1 tablet (80 mg total) by mouth daily. 04/14/19   Corwin LevinsJohn, James W, MD  labetalol (NORMODYNE) 200 MG tablet Take 2 tablets (400 mg total) by mouth 2 (two) times daily. 04/14/19   Corwin LevinsJohn, James W, MD  sildenafil (VIAGRA) 100 MG tablet Take 0.5-1 tablets (50-100 mg total) by mouth daily as needed for erectile dysfunction. 10/12/18   Corwin LevinsJohn, James W, MD  telmisartan (MICARDIS) 80 MG tablet  Take 1 tablet (80 mg total) by mouth daily. 04/14/19   Corwin LevinsJohn, James W, MD    Physical Exam: Vitals:   07/04/19 2045 07/05/19 0007 07/05/19 0145 07/05/19 0400  BP: (!) 198/92 (!) 170/91 (!) 153/95 (!) 176/98  Pulse: (!) 58 61 60 68  Resp: 17 (!) 23 (!) 21 12  Temp:      TempSrc:      SpO2: 98% 97% 95% 97%  Weight:      Height:        Physical Exam  Constitutional: He is oriented to person, place, and time. He appears well-developed and well-nourished. No distress.  HENT:  Head: Normocephalic.  Eyes: Pupils are equal, round, and reactive to light. Right eye exhibits no discharge. Left eye exhibits no discharge.  Neck: Neck supple.  Cardiovascular: Normal rate, regular rhythm and intact distal pulses.  Pulmonary/Chest: Effort normal and breath sounds normal. No respiratory distress. He has no wheezes. He has no rales.  Abdominal: Soft. Bowel sounds are normal. He exhibits no distension. There is no abdominal tenderness. There is no guarding.  Musculoskeletal:        General: No edema.  Neurological: He is alert and oriented to person, place, and time. No cranial nerve deficit.  Speech fluent, tongue midline, no facial droop Strength 5 out of 5 in bilateral upper and lower extremities Sensation to light touch intact throughout  Skin: Skin is warm and dry. He is not diaphoretic.     Labs on Admission: I have personally reviewed following labs and imaging studies  CBC: Recent Labs  Lab 07/04/19 2124  WBC 7.0  NEUTROABS 5.1  HGB 14.4  HCT 43.5  MCV 90.4  PLT 255   Basic Metabolic Panel: Recent Labs  Lab 07/04/19 2124  NA 139  K 3.8  CL 105  CO2 26  GLUCOSE 95  BUN 12  CREATININE 1.09  CALCIUM 9.3   GFR: Estimated Creatinine Clearance: 95.4 mL/min (by C-G formula based on SCr of 1.09 mg/dL). Liver Function Tests: No results for input(s): AST, ALT, ALKPHOS, BILITOT, PROT, ALBUMIN in the last 168 hours. No results for input(s): LIPASE, AMYLASE in the last 168  hours. No results for input(s): AMMONIA in the last 168 hours. Coagulation Profile: No results for input(s): INR, PROTIME in the last 168 hours. Cardiac Enzymes: No results for input(s): CKTOTAL, CKMB, CKMBINDEX, TROPONINI in the last 168 hours. BNP (last 3 results) No results for input(s): PROBNP in the last 8760 hours. HbA1C: No results for input(s): HGBA1C in the last 72 hours. CBG: No results for input(s): GLUCAP in the last 168 hours. Lipid Profile: No results for input(s): CHOL, HDL, LDLCALC, TRIG, CHOLHDL, LDLDIRECT in the last 72 hours. Thyroid Function Tests: No results for input(s): TSH, T4TOTAL, FREET4, T3FREE, THYROIDAB in the last 72 hours. Anemia Panel:  No results for input(s): VITAMINB12, FOLATE, FERRITIN, TIBC, IRON, RETICCTPCT in the last 72 hours. Urine analysis:    Component Value Date/Time   COLORURINE YELLOW 10/12/2018 1615   APPEARANCEUR CLEAR 10/12/2018 1615   LABSPEC 1.015 10/12/2018 1615   PHURINE 6.0 10/12/2018 1615   GLUCOSEU NEGATIVE 10/12/2018 1615   HGBUR SMALL (A) 10/12/2018 1615   BILIRUBINUR NEGATIVE 10/12/2018 1615   KETONESUR NEGATIVE 10/12/2018 1615   PROTEINUR NEGATIVE 02/13/2010 1137   UROBILINOGEN 0.2 10/12/2018 1615   NITRITE NEGATIVE 10/12/2018 1615   LEUKOCYTESUR NEGATIVE 10/12/2018 1615    Radiological Exams on Admission: Ct Head Wo Contrast  Result Date: 07/04/2019 CLINICAL DATA:  Transient global amnesia. Altered mental status. EXAM: CT HEAD WITHOUT CONTRAST TECHNIQUE: Contiguous axial images were obtained from the base of the skull through the vertex without intravenous contrast. COMPARISON:  None. FINDINGS: Brain: No evidence of acute infarction, hemorrhage, hydrocephalus, extra-axial collection or mass lesion/mass effect. Diffuse mild cerebral cortical atrophy. Vascular: No hyperdense vessel or unexpected calcification. Skull: Normal. Negative for fracture or focal lesion. Sinuses/Orbits: Normal. Other: None IMPRESSION: No acute  abnormalities. Diffuse mild cerebral cortical atrophy. Electronically Signed   By: Francene Boyers M.D.   On: 07/04/2019 21:16    EKG: Independently reviewed.  Sinus rhythm, nonspecific T wave abnormalities.  No significant change since prior tracing.  Assessment/Plan Principal Problem:   AMS (altered mental status) Active Problems:   Hypertensive urgency   Altered mental status/amnesia Currently AAO x3 but appears to have difficulty remembering events from yesterday morning.  Head CT negative for acute intracranial abnormality.  Neuro exam nonfocal.  Blood pressure significantly elevated (220/112) by EMS.  Blood ethanol level negative.  Afebrile and no leukocytosis.  No infectious signs or symptoms.  Differentials include transient global amnesia, TIA, hypertensive encephalopathy, and metabolic encephalopathy. -Stat brain MRI ordered.  Patient had previously refused MRI due to claustrophobia.  Discussed giving him Ativan prior to the MRI and patient is agreeable.  If unsuccessful, may need higher level of sedation/general anesthesia in the morning. -Check TSH and ammonia levels -UDS -UA -EEG -Management of hypertensive urgency as mentioned below -Neurology consulted, appreciate recommendations  Hypertensive urgency Blood pressure significantly elevated on arrival.  Likely secondary to medication nonadherence.  Patient states he did not take labetalol yesterday.  Head CT negative.  Patient has no complaints of chest pain or shortness of breath.  Lungs clear on exam. -IV hydralazine as needed for SBP greater than 180 -Resume home medications in the morning after correct dosing is verified by pharmacy  Pharmacy med rec pending.  DVT prophylaxis: Lovenox Code Status: Full code Family Communication: Wife at bedside. Disposition Plan: Anticipate discharge after clinical improvement. Consults called: Neurology Admission status: It is my clinical opinion that referral for OBSERVATION is  reasonable and necessary in this patient based on the above information provided. The aforementioned taken together are felt to place the patient at high risk for further clinical deterioration. However it is anticipated that the patient may be medically stable for discharge from the hospital within 24 to 48 hours.  The medical decision making on this patient was of high complexity and the patient is at high risk for clinical deterioration, therefore this is a level 3 visit.  John Giovanni MD [facility] Pager [phone]  If 7PM-7AM, please contact night-coverage [email or web address] Password [email or web address]  07/05/2019, 4:25 AM

## 2019-07-05 NOTE — Plan of Care (Signed)
  Problem: Education: Goal: Knowledge of disease or condition will improve Outcome: Adequate for Discharge Goal: Knowledge of secondary prevention will improve Outcome: Adequate for Discharge   Problem: Education: Goal: Knowledge of disease or condition will improve Outcome: Adequate for Discharge Goal: Knowledge of secondary prevention will improve Outcome: Adequate for Discharge Goal: Knowledge of patient specific risk factors addressed and post discharge goals established will improve Outcome: Adequate for Discharge Goal: Individualized Educational Video(s) Outcome: Adequate for Discharge   Problem: Coping: Goal: Will verbalize positive feelings about self Outcome: Adequate for Discharge Goal: Will identify appropriate support needs Outcome: Adequate for Discharge   Problem: Health Behavior/Discharge Planning: Goal: Ability to manage health-related needs will improve Outcome: Adequate for Discharge   Problem: Self-Care: Goal: Ability to participate in self-care as condition permits will improve Outcome: Adequate for Discharge Goal: Verbalization of feelings and concerns over difficulty with self-care will improve Outcome: Adequate for Discharge Goal: Ability to communicate needs accurately will improve Outcome: Adequate for Discharge   Problem: Nutrition: Goal: Risk of aspiration will decrease Outcome: Adequate for Discharge Goal: Dietary intake will improve Outcome: Adequate for Discharge   Problem: Intracerebral Hemorrhage Tissue Perfusion: Goal: Complications of Intracerebral Hemorrhage will be minimized Outcome: Adequate for Discharge   Problem: Ischemic Stroke/TIA Tissue Perfusion: Goal: Complications of ischemic stroke/TIA will be minimized Outcome: Adequate for Discharge   Problem: Spontaneous Subarachnoid Hemorrhage Tissue Perfusion: Goal: Complications of Spontaneous Subarachnoid Hemorrhage will be minimized Outcome: Adequate for Discharge

## 2019-07-05 NOTE — Discharge Summary (Signed)
Physician Discharge Summary  Peter HugerFrank L Coate NWG:[id number] DOB: November 09, 1955 DOA: 07/04/2019  PCP: Corwin LevinsJohn, James W, MD  Admit date: 07/04/2019 Discharge date: 07/05/2019  Admitted From: home Discharge disposition: home   Recommendations for Outpatient Follow-Up:   1. Continue with tight BP control 2. Will need a post-contrast MRI   Discharge Diagnosis:   Principal Problem:   AMS (altered mental status) Active Problems:   Hypertensive urgency transient global amnesia    Discharge Condition: Improved.  Diet recommendation: Low sodium, heart healthy.  Carbohydrate-modified  Wound care: None.  Code status: Full.   History of Present Illness:   Peter Kent is a 63 y.o. male with medical history significant of hypertension, hyperlipidemia, GERD presenting to the ED via EMS for evaluation of altered mental status.  History provided by patient and wife at bedside.  Wife states she visited her parents house with the patient and the left at 4:30 PM.  Patient was driving the car and appeared confused.  He asked his wife where they were going.  She told him they were going to [employer] [facility] but a little bit later he asked the same question again.  She again had to explain to him where they were going.  He was confused and was asking why they were going there.  Wife did not notice any slurring of speech or drooping of his face.  No focal weakness.  No seizure-like activity noted and no prior history of seizures.  No prior history of stroke.  States she did baking in the morning but patient could not remember that.  She asked him some more questions and he could not remember any events prior to 4:30 PM.  He could not remember that they had just left her parents house.  Patient denies ethanol or drug use.  Denies headaches, neck stiffness, weakness, or numbness.  No recent illness.  Denies fevers or chills.  States he normally takes both amlodipine and labetalol for high  blood pressure but did not take labetalol yesterday.   Hospital Course by Problem:   Assessment: Transient global amnesia versus hypertensive emergency.  Patient's blood pressure slowly been brought down  -will need further management -EEG negative -MRI w/o emergent finding. But there is a 2. 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, recommend postcontrast MRI after convalescence -ammonia and TSH normal   Medical Consultants:   neurology   Discharge Exam:   Vitals:   07/05/19 1258 07/05/19 1657  BP: (!) 179/86 (!) 168/96  Pulse: 64 67  Resp: 18 16  Temp: 98.4 F (36.9 C) 98 F (36.7 C)  SpO2: 96% 100%   Vitals:   07/05/19 0649 07/05/19 0817 07/05/19 1258 07/05/19 1657  BP:  (!) 166/80 (!) 179/86 (!) 168/96  Pulse:  72 64 67  Resp:  18 18 16   Temp:  98.4 F (36.9 C) 98.4 F (36.9 C) 98 F (36.7 C)  TempSrc:  Oral Oral Oral  SpO2:  100% 96% 100%  Weight: 124.1 kg     Height: 6\' 2"  (1.88 m)       General exam: Appears calm and comfortable.   The results of significant diagnostics from this hospitalization (including imaging, microbiology, ancillary and laboratory) are listed below for reference.     Procedures and Diagnostic Studies:   Ct Head Wo Contrast  Result Date: 07/04/2019 CLINICAL DATA:  Transient global amnesia. Altered mental status. EXAM: CT HEAD WITHOUT CONTRAST TECHNIQUE:  Contiguous axial images were obtained from the base of the skull through the vertex without intravenous contrast. COMPARISON:  None. FINDINGS: Brain: No evidence of acute infarction, hemorrhage, hydrocephalus, extra-axial collection or mass lesion/mass effect. Diffuse mild cerebral cortical atrophy. Vascular: No hyperdense vessel or unexpected calcification. Skull: Normal. Negative for fracture or focal lesion. Sinuses/Orbits: Normal. Other: None IMPRESSION: No acute abnormalities. Diffuse mild cerebral cortical atrophy. Electronically Signed   By: Francene Boyers  M.D.   On: 07/04/2019 21:16   Mr Brain Wo Contrast  Result Date: 07/05/2019 CLINICAL DATA:  Altered mental status EXAM: MRI HEAD WITHOUT CONTRAST TECHNIQUE: Multiplanar, multiecho pulse sequences of the brain and surrounding structures were obtained without intravenous contrast. COMPARISON:  Head CT from earlier today FINDINGS: Brain: No acute infarction, hemorrhage, hydrocephalus, extra-axial collection or mass effect. Best seen on diffusion and FLAIR imaging there is a dural based nodule along the right paramedian clivus which measures 8 x 4 mm. Minimal chronic small vessel ischemic type change in the white matter. There is vascular risk factors in the medical history. Vascular: Normal flow voids Skull and upper cervical spine: Negative for marrow lesion Sinuses/Orbits: Negative IMPRESSION: 1. No emergent finding. 2. 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 Electronically Signed   By: Marnee Spring M.D.   On: 07/05/2019 06:28     Labs:   Basic Metabolic Panel: Recent Labs  Lab 07/04/19 2124  NA 139  K 3.8  CL 105  CO2 26  GLUCOSE 95  BUN 12  CREATININE 1.09  CALCIUM 9.3   GFR Estimated Creatinine Clearance: 97.1 mL/min (by C-G formula based on SCr of 1.09 mg/dL). Liver Function Tests: No results for input(s): AST, ALT, ALKPHOS, BILITOT, PROT, ALBUMIN in the last 168 hours. No results for input(s): LIPASE, AMYLASE in the last 168 hours. Recent Labs  Lab 07/05/19 0644  AMMONIA 30   Coagulation profile No results for input(s): INR, PROTIME in the last 168 hours.  CBC: Recent Labs  Lab 07/04/19 2124  WBC 7.0  NEUTROABS 5.1  HGB 14.4  HCT 43.5  MCV 90.4  PLT 255   Cardiac Enzymes: No results for input(s): CKTOTAL, CKMB, CKMBINDEX, TROPONINI in the last 168 hours. BNP: Invalid input(s): POCBNP CBG: No results for input(s): GLUCAP in the last 168 hours. D-Dimer No results for input(s): DDIMER in  the last 72 hours. Hgb A1c No results for input(s): HGBA1C in the last 72 hours. Lipid Profile No results for input(s): CHOL, HDL, LDLCALC, TRIG, CHOLHDL, LDLDIRECT in the last 72 hours. Thyroid function studies Recent Labs    07/05/19 0644  TSH 0.914   Anemia work up No results for input(s): VITAMINB12, FOLATE, FERRITIN, TIBC, IRON, RETICCTPCT in the last 72 hours. Microbiology Recent Results (from the past 240 hour(s))  SARS CORONAVIRUS 2 (TAT 6-24 HRS) Nasopharyngeal Nasopharyngeal Swab     Status: None   Collection Time: 07/05/19  4:18 AM   Specimen: Nasopharyngeal Swab  Result Value Ref Range Status   SARS Coronavirus 2 NEGATIVE NEGATIVE Final    Comment: (NOTE) SARS-CoV-2 target nucleic acids are NOT DETECTED. The SARS-CoV-2 RNA is generally detectable in upper and lower respiratory specimens during the acute phase of infection. Negative results do not preclude SARS-CoV-2 infection, do not rule out co-infections with other pathogens, and should not be used as the sole basis for treatment or other patient management decisions. Negative results must be combined with clinical observations, patient history, and  epidemiological information. The expected result is Negative. Fact Sheet for Patients: SugarRoll.be Fact Sheet for Healthcare Providers: https://www.woods-mathews.com/ This test is not yet approved or cleared by the [place] FDA and  has been authorized for detection and/or diagnosis of SARS-CoV-2 by FDA under an Emergency Use Authorization (EUA). This EUA will remain  in effect (meaning this test can be used) for the duration of the COVID-19 declaration under Section 56 4(b)(1) of the Act, 21 U.S.C. section 360bbb-3(b)(1), unless the authorization is terminated or revoked sooner. Performed at [facility], [place] [address] [place], [place] [address]      Discharge Instructions:   Discharge Instructions     Diet - low sodium heart healthy   Complete by: As directed    Increase activity slowly   Complete by: As directed      Allergies as of 07/05/2019   No Known Allergies     Medication List    STOP taking these medications   NON FORMULARY     TAKE these medications   acetaminophen 650 MG CR tablet Commonly known as: TYLENOL Take 650 mg by mouth every 8 (eight) hours as needed for pain.   amLODipine 10 MG tablet Commonly known as: NORVASC Take 1 tablet (10 mg total) by mouth daily.   aspirin 81 MG tablet Take 81 mg by mouth daily.   atorvastatin 80 MG tablet Commonly known as: LIPITOR Take 1 tablet (80 mg total) by mouth daily. What changed: when to take this   BLACK CURRANT SEED OIL PO Take 15 mLs by mouth daily.   L-ARGININE PO Take 1 capsule by mouth daily.   labetalol 200 MG tablet Commonly known as: NORMODYNE Take 2 tablets (400 mg total) by mouth 2 (two) times daily.   sildenafil 100 MG tablet Commonly known as: Viagra Take 0.5-1 tablets (50-100 mg total) by mouth daily as needed for erectile dysfunction.   telmisartan 80 MG tablet Commonly known as: Micardis Take 1 tablet (80 mg total) by mouth daily.   TURMERIC PO Take 1 capsule by mouth daily with breakfast.   zinc gluconate 50 MG tablet Take 50 mg by mouth daily.      Follow-up Information    Biagio Borg, MD Follow up in 1 week(s).   Specialties: Internal Medicine, Radiology Why: BP check Contact information: [place] [address] [phone]            Time coordinating discharge: 25 min  Signed:  Geradine Girt DO  [facility] 07/06/2019, 1:16 PM

## 2019-07-05 NOTE — ED Notes (Signed)
Per MRI, patient advises "he is not getting back in the scanner, nor taking medication for the MRI". Provider aware that scan was unsuccessful.

## 2019-07-05 NOTE — Progress Notes (Signed)
NEUROLOGY PROGRESS NOTE  Subjective: Patient feels completely back to baseline.  No confusion at this time  Exam: Vitals:   07/05/19 0647 07/05/19 0817  BP: (!) 177/94 (!) 166/80  Pulse: 72 72  Resp: 17 18  Temp: (!) 97.4 F (36.3 C) 98.4 F (36.9 C)  SpO2: 100% 100%    ROS General ROS: negative for - chills, fatigue, fever, night sweats, weight gain or weight loss Psychological ROS: negative for - behavioral disorder, hallucinations, memory difficulties, mood swings or suicidal ideation Ophthalmic ROS: negative for - blurry vision, double vision, eye pain or loss of vision ENT ROS: negative for - epistaxis, nasal discharge, oral lesions, sore throat, tinnitus or vertigoe Respiratory ROS: negative for - cough, hemoptysis, shortness of breath or wheezing Cardiovascular ROS: negative for - chest pain, dyspnea on exertion, edema or irregular heartbeat Gastrointestinal ROS: negative for - abdominal pain, diarrhea, hematemesis, nausea/vomiting or stool incontinence Genito-Urinary ROS: negative for - dysuria, hematuria, incontinence or urinary frequency/urgency Musculoskeletal ROS: negative for - joint swelling or muscular weakness Neurological ROS: as noted in HPI Dermatological ROS: negative for rash and skin lesion changes        Physical Exam  Constitutional: Appears well-developed and well-nourished.  Psych: Affect appropriate to situation Eyes: No scleral injection HENT: No OP obstrucion Head: Normocephalic.  Cardiovascular: Normal rate and regular rhythm.  Respiratory: Effort normal, non-labored breathing GI: Soft.  No distension. There is no tenderness.  Skin: WDI   Neuro:  Mental Status: Alert, oriented, thought content appropriate.  Speech fluent without evidence of aphasia.  Able to follow 3 step commands without difficulty. Cranial Nerves: II:  Visual fields grossly normal,  III,IV, VI: ptosis not present, extra-ocular motions intact bilaterally pupils equal,  round, reactive to light and accommodation V,VII: smile symmetric slight right facial droop at rest, facial light touch sensation normal bilaterally VIII: hearing normal bilaterally IX,X: Palate rises midline XI: bilateral shoulder shrug XII: midline tongue extension Motor: Right : Upper extremity   5/5    Left:     Upper extremity   5/5  Lower extremity   5/5     Lower extremity   5/5 Tone and bulk:normal tone throughout; no atrophy noted Sensory: Pinprick and light touch intact throughout, bilaterally Deep Tendon Reflexes: 2+ and symmetric throughout Plantars: Right: downgoing   Left: downgoing     Medications:  Prior to Admission:  Medications Prior to Admission  Medication Sig Dispense Refill Last Dose  . acetaminophen (TYLENOL) 650 MG CR tablet Take 650 mg by mouth every 8 (eight) hours as needed for pain.   unk at [employer]  . amLODipine (NORVASC) 10 MG tablet Take 1 tablet (10 mg total) by mouth daily. 90 tablet 3 07/04/2019 at Unknown time  . aspirin 81 MG tablet Take 81 mg by mouth daily.     07/04/2019 at 0900  . atorvastatin (LIPITOR) 80 MG tablet Take 1 tablet (80 mg total) by mouth daily. (Patient taking differently: Take 80 mg by mouth every evening. ) 90 tablet 3 07/03/2019 at pm  . BLACK CURRANT SEED OIL PO Take 15 mLs by mouth daily.   07/04/2019 at am  . L-ARGININE PO Take 1 capsule by mouth daily.   07/04/2019 at am  . labetalol (NORMODYNE) 200 MG tablet Take 2 tablets (400 mg total) by mouth 2 (two) times daily. 360 tablet 3 07/04/2019 at am  . NON FORMULARY Take 1 tablet by mouth See admin instructions. [place] tablets: Take 1 tablet by  mouth once a day   07/04/2019 at am  . telmisartan (MICARDIS) 80 MG tablet Take 1 tablet (80 mg total) by mouth daily. 90 tablet 3 07/04/2019 at am  . TURMERIC PO Take 1 capsule by mouth daily with breakfast.   07/04/2019 at am  . zinc gluconate 50 MG tablet Take 50 mg by mouth daily.   07/04/2019 at am  . sildenafil (VIAGRA) 100 MG  tablet Take 0.5-1 tablets (50-100 mg total) by mouth daily as needed for erectile dysfunction. 5 tablet 11 See LF at unk   Scheduled: . enoxaparin (LOVENOX) injection  40 mg Subcutaneous Q24H    Pertinent Labs/Diagnostics:  EEG: Showed no epileptiform activity  Ct Head Wo Contrast  Result Date: 07/04/2019  IMPRESSION: No acute abnormalities. Diffuse mild cerebral cortical atrophy. Electronically Signed   By: Francene Boyers M.D.   On: 07/04/2019 21:16   Mr Brain Wo Contrast  Result Date: 07/05/2019 IMPRESSION: 1. No emergent finding. 2. 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 Electronically Signed   By: Marnee Spring M.D.   On: 07/05/2019 06:28       Felicie Morn PA-C [facility] [phone]   Assessment: Transient global amnesia versus hypertensive emergency.  Patient's blood pressure slowly been brought down to 160s/80.  At present time patient is back to baseline.    Recommendations: -Obtain EEG if negative neurology will sign off thank you for this consult if you have any questions please contact us    07/05/2019, 10:13 AM

## 2019-07-06 ENCOUNTER — Telehealth: Payer: Self-pay | Admitting: *Deleted

## 2019-07-06 NOTE — Telephone Encounter (Signed)
Transition Care Management Follow-up Telephone Call  Date discharged? 07/05/19   How have you been since you were released from the hospital? Pt states he is doing ok   Do you understand why you were in the hospital? YES   Do you understand the discharge instructions? YES   Where were you discharged to? Home   Items Reviewed:  Medications reviewed: YES, pt states his medications are still the same  Allergies reviewed: YES  Dietary changes reviewed: YES, heart healthy  Referrals reviewed: No referral recommeded   Functional Questionnaire:   Activities of Daily Living (ADLs):   He states he are independent in the following: ambulation, bathing and hygiene, feeding, continence, grooming, toileting and dressing States he doesnt require assistance    Any transportation issues/concerns?: NO   Any patient concerns? NO   Confirmed importance and date/time of follow-up visits scheduled YES, appt 07/14/19  Provider Appointment booked with Dr. Jenny Reichmann  Confirmed with patient if condition begins to worsen call PCP or go to the ER.  Patient was given the office number and encouraged to call back with question or concerns.  : YES

## 2019-07-14 ENCOUNTER — Encounter: Payer: Self-pay | Admitting: Internal Medicine

## 2019-07-14 ENCOUNTER — Ambulatory Visit (INDEPENDENT_AMBULATORY_CARE_PROVIDER_SITE_OTHER): Payer: 59 | Admitting: Internal Medicine

## 2019-07-14 ENCOUNTER — Other Ambulatory Visit: Payer: Self-pay

## 2019-07-14 VITALS — BP 140/88 | HR 89 | Temp 97.8°F | Ht 74.0 in | Wt 271.0 lb

## 2019-07-14 DIAGNOSIS — I10 Essential (primary) hypertension: Secondary | ICD-10-CM | POA: Diagnosis not present

## 2019-07-14 DIAGNOSIS — G9389 Other specified disorders of brain: Secondary | ICD-10-CM

## 2019-07-14 DIAGNOSIS — G454 Transient global amnesia: Secondary | ICD-10-CM

## 2019-07-14 DIAGNOSIS — R7302 Impaired glucose tolerance (oral): Secondary | ICD-10-CM

## 2019-07-14 DIAGNOSIS — E785 Hyperlipidemia, unspecified: Secondary | ICD-10-CM

## 2019-07-14 NOTE — Progress Notes (Signed)
Subjective:    Patient ID: Peter Kent, male    DOB: 1956-05-01, 63 y.o.   MRN: [id number]  HPI  [name] a 63 y.o.malewith medical history significant ofhypertension, hyperlipidemia, GERD presenting to the ED via EMS for evaluation of altered mental status c/w Transient global amnesia versus hypertensive emergency. Patient's blood pressure slowly been brought down  -will need further management.  EEG negative  MRI w/o emergent finding.But there is a 2. 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  Pt denies chest pain, increased sob or doe, wheezing, orthopnea, PND, increased LE swelling, palpitations, dizziness or syncope.  Pt denies new neurological symptoms such as new headache, or facial or extremity weakness or numbness   Pt denies polydipsia, polyuria Past Medical History:  Diagnosis Date  . ERECTILE DYSFUNCTION 04/08/2007  . Full dentures   . GERD (gastroesophageal reflux disease)   . HEAD TRAUMA, CLOSED 04/08/2007  . HYPERCHOLESTEROLEMIA 04/08/2007  . HYPERLIPIDEMIA 04/11/2007  . HYPERTENSION 04/08/2007  . Impaired glucose tolerance 07/05/2013  . Morbid obesity (HCC) 04/08/2007  . Preventative health care 03/22/2011  . Wears glasses    Past Surgical History:  Procedure Laterality Date  . CIRCUMCISION    . INSERTION OF MESH N/A 10/19/2013   Procedure: INSERTION OF MESH;  Surgeon: Ernestene Mention, MD;  Location: [facility];  Service: General;  Laterality: N/A;  . MULTIPLE TOOTH EXTRACTIONS    . UMBILICAL HERNIA REPAIR N/A 10/19/2013   Procedure: UMBILICAL HERNIA REPAIR WITH MESH;  Surgeon: Ernestene Mention, MD;  Location: [facility];  Service: General;  Laterality: N/A;    reports that he has never smoked. He has never used smokeless tobacco. He reports that he does not drink alcohol or use drugs. family history includes Cancer in an other family member; Diabetes in an  other family member; Heart disease in an other family member; Hypertension in an other family member. No Known Allergies Current Outpatient Medications on File Prior to Visit  Medication Sig Dispense Refill  . acetaminophen (TYLENOL) 650 MG CR tablet Take 650 mg by mouth every 8 (eight) hours as needed for pain.    Marland Kitchen amLODipine (NORVASC) 10 MG tablet Take 1 tablet (10 mg total) by mouth daily. 90 tablet 3  . aspirin 81 MG tablet Take 81 mg by mouth daily.      Marland Kitchen atorvastatin (LIPITOR) 80 MG tablet Take 1 tablet (80 mg total) by mouth daily. (Patient taking differently: Take 80 mg by mouth every evening. ) 90 tablet 3  . BLACK CURRANT SEED OIL PO Take 15 mLs by mouth daily.    . L-ARGININE PO Take 1 capsule by mouth daily.    Marland Kitchen labetalol (NORMODYNE) 200 MG tablet Take 2 tablets (400 mg total) by mouth 2 (two) times daily. 360 tablet 3  . sildenafil (VIAGRA) 100 MG tablet Take 0.5-1 tablets (50-100 mg total) by mouth daily as needed for erectile dysfunction. 5 tablet 11  . telmisartan (MICARDIS) 80 MG tablet Take 1 tablet (80 mg total) by mouth daily. 90 tablet 3  . TURMERIC PO Take 1 capsule by mouth daily with breakfast.    . zinc gluconate 50 MG tablet Take 50 mg by mouth daily.     No current facility-administered medications on file prior to visit.    Review of Systems  Constitutional: Negative for other unusual diaphoresis or sweats HENT: Negative for ear discharge or  swelling Eyes: Negative for other worsening visual disturbances Respiratory: Negative for stridor or other swelling  Gastrointestinal: Negative for worsening distension or other blood Genitourinary: Negative for retention or other urinary change Musculoskeletal: Negative for other MSK pain or swelling Skin: Negative for color change or other new lesions Neurological: Negative for worsening tremors and other numbness  Psychiatric/Behavioral: Negative for worsening agitation or other fatigue All otherwise neg per pt      Objective:   Physical Exam BP 140/88   Pulse 89   Temp 97.8 F (36.6 C) (Oral)   Ht 6\' 2"  (1.88 m)   Wt 271 lb (122.9 kg)   SpO2 99%   BMI 34.79 kg/m  VS noted,  Constitutional: Pt appears in NAD HENT: Head: NCAT.  Right Ear: External ear normal.  Left Ear: External ear normal.  Eyes: . Pupils are equal, round, and reactive to light. Conjunctivae and EOM are normal Nose: without d/c or deformity Neck: Neck supple. Gross normal ROM Cardiovascular: Normal rate and regular rhythm.   Pulmonary/Chest: Effort normal and breath sounds without rales or wheezing.  Abd:  Soft, NT, ND, + BS, no organomegaly Neurological: Pt is alert. At baseline orientation, motor grossly intact Skin: Skin is warm. No rashes, other new lesions, no LE edema Psychiatric: Pt behavior is normal without agitation  All otherwise neg per pt   Lab Results  Component Value Date   WBC 7.0 07/04/2019   HGB 14.4 07/04/2019   HCT 43.5 07/04/2019   PLT 255 07/04/2019   GLUCOSE 95 07/04/2019   CHOL 247 (H) 10/12/2018   TRIG 133.0 10/12/2018   HDL 54.50 10/12/2018   LDLDIRECT 156.1 09/23/2007   LDLCALC 166 (H) 10/12/2018   ALT 15 10/12/2018   AST 18 10/12/2018   NA 139 07/04/2019   K 3.8 07/04/2019   CL 105 07/04/2019   CREATININE 1.09 07/04/2019   BUN 12 07/04/2019   CO2 26 07/04/2019   TSH 0.914 07/05/2019   PSA 0.88 10/12/2018   HGBA1C 5.9 10/12/2018      Assessment & Plan:

## 2019-07-14 NOTE — Patient Instructions (Signed)
Please continue all other medications as before, and refills have been done if requested.  Please have the pharmacy call with any other refills you may need.  Please continue your efforts at being more active, low cholesterol diet, and weight control.  Please keep your appointments with your specialists as you may have planned     

## 2019-07-16 ENCOUNTER — Encounter: Payer: Self-pay | Admitting: Internal Medicine

## 2019-07-16 DIAGNOSIS — G9389 Other specified disorders of brain: Secondary | ICD-10-CM | POA: Insufficient documentation

## 2019-07-16 NOTE — Assessment & Plan Note (Signed)
stable overall by history and exam, recent data reviewed with pt, and pt to continue medical treatment as before,  to f/u any worsening symptoms or concerns  

## 2019-07-16 NOTE — Assessment & Plan Note (Signed)
Small, for f/u MRI brain

## 2019-07-16 NOTE — Assessment & Plan Note (Signed)
Resolved,  to f/u any worsening symptoms or concerns  

## 2019-07-27 ENCOUNTER — Encounter: Payer: Self-pay | Admitting: Internal Medicine

## 2019-08-15 ENCOUNTER — Telehealth: Payer: Self-pay | Admitting: Internal Medicine

## 2019-08-15 ENCOUNTER — Ambulatory Visit
Admission: RE | Admit: 2019-08-15 | Discharge: 2019-08-15 | Disposition: A | Discharge status: Home / Self Care | Payer: 59 | Source: Ambulatory Visit | Attending: Internal Medicine | Admitting: Internal Medicine

## 2019-08-15 DIAGNOSIS — G9389 Other specified disorders of brain: Secondary | ICD-10-CM

## 2019-08-15 NOTE — Telephone Encounter (Signed)
Huntley Dec, called to inform the office that patient had to cancel MRI (w/o contrast) for today.  States that patient was claustrophobic.  Stated that patient was to call the office to get a script prescribed or an order to go to the hospital so he could be sedated there.   Stated also that if patient follows through with order, it  Should also be changed to MRI with and without contrast.

## 2019-08-16 MED ORDER — DIAZEPAM 5 MG PO TABS: ORAL_TABLET | ORAL | 0 refills | Status: DC

## 2019-08-16 NOTE — Telephone Encounter (Signed)
Ok fro valium 5 mg x 1  - done erx  MRI re-ordered

## 2019-08-17 NOTE — Telephone Encounter (Signed)
Notified pt w/MD response.../lmb 

## 2019-10-12 ENCOUNTER — Ambulatory Visit: Payer: 59 | Admitting: Internal Medicine

## 2019-10-24 ENCOUNTER — Other Ambulatory Visit: Payer: Self-pay | Admitting: Internal Medicine

## 2020-01-16 ENCOUNTER — Ambulatory Visit: Payer: 59 | Admitting: Internal Medicine

## 2020-03-27 ENCOUNTER — Ambulatory Visit: Payer: 59 | Admitting: Internal Medicine

## 2020-03-27 ENCOUNTER — Other Ambulatory Visit: Payer: Self-pay

## 2020-03-27 ENCOUNTER — Encounter: Payer: Self-pay | Admitting: Internal Medicine

## 2020-03-27 VITALS — BP 144/92 | HR 100 | Temp 97.9°F | Wt 250.6 lb

## 2020-03-27 DIAGNOSIS — R7302 Impaired glucose tolerance (oral): Secondary | ICD-10-CM

## 2020-03-27 DIAGNOSIS — Z0000 Encounter for general adult medical examination without abnormal findings: Secondary | ICD-10-CM

## 2020-03-27 DIAGNOSIS — Z23 Encounter for immunization: Secondary | ICD-10-CM | POA: Diagnosis not present

## 2020-03-27 DIAGNOSIS — E538 Deficiency of other specified B group vitamins: Secondary | ICD-10-CM

## 2020-03-27 DIAGNOSIS — E559 Vitamin D deficiency, unspecified: Secondary | ICD-10-CM | POA: Diagnosis not present

## 2020-03-27 MED ORDER — AMLODIPINE BESYLATE 10 MG PO TABS: 10.0000 mg | ORAL_TABLET | Freq: Every day | ORAL | 3 refills | Status: DC

## 2020-03-27 NOTE — Patient Instructions (Addendum)
You had the Tdap tetanus shot today  Please continue all other medications as before, and refills have been done if requested - only for the medications you are taking now  Please have the pharmacy call with any other refills you may need.  Please continue your efforts at being more active, low cholesterol diet, and weight control.  You are otherwise up to date with prevention measures today.  Please keep your appointments with your specialists as you may have planned  Please go to the LAB at the blood drawing area for the tests to be done  You will be contacted by phone if any changes need to be made immediately.  Otherwise, you will receive a letter about your results with an explanation, but please check with MyChart first.  Please remember to sign up for MyChart if you have not done so, as this will be important to you in the future with finding out test results, communicating by private email, and scheduling acute appointments online when needed.  Please make an Appointment to return in 6 months, or sooner if needed

## 2020-03-27 NOTE — Progress Notes (Signed)
Subjective:    Patient ID: Peter Kent, male    DOB: 1955-12-02, 64 y.o.   MRN: [id number]  HPI Here for wellness and f/u;  Overall doing ok;  Pt denies Chest pain, worsening SOB, DOE, wheezing, orthopnea, PND, worsening LE edema, palpitations, dizziness or syncope.  Pt denies neurological change such as new headache, facial or extremity weakness.  Pt denies polydipsia, polyuria, or low sugar symptoms. Pt states overall good compliance with treatment and medications, good tolerability, and has been trying to follow appropriate diet.  Pt denies worsening depressive symptoms, suicidal ideation or panic. No fever, night sweats, wt loss, loss of appetite, or other constitutional symptoms.  Pt states good ability with ADL's, has low fall risk, home safety reviewed and adequate, no other significant changes in hearing or vision, and only occasionally active with exercise. Losing wt steadily with better diet.   Wt Readings from Last 3 Encounters:  03/27/20 250 lb 9.6 oz (113.7 kg)  07/14/19 271 lb (122.9 kg)  07/05/19 273 lb 9.5 oz (124.1 kg)  Never saw urology from mar 2020 for microhematuria due to covid.  Not interested in statin for now, wants to work more on diet, due to cognitive side effect.  Declines further MRI brain for now as well. Bp improved, does not want med change , wants to lose more wt.  Only taking the amlodipien for BP Past Medical History:  Diagnosis Date  . ERECTILE DYSFUNCTION 04/08/2007  . Full dentures   . GERD (gastroesophageal reflux disease)   . HEAD TRAUMA, CLOSED 04/08/2007  . HYPERCHOLESTEROLEMIA 04/08/2007  . HYPERLIPIDEMIA 04/11/2007  . HYPERTENSION 04/08/2007  . Impaired glucose tolerance 07/05/2013  . Morbid obesity (HCC) 04/08/2007  . Preventative health care 03/22/2011  . Wears glasses    Past Surgical History:  Procedure Laterality Date  . CIRCUMCISION    . INSERTION OF MESH N/A 10/19/2013   Procedure: INSERTION OF MESH;  Surgeon: Ernestene Mention, MD;   Location: [facility];  Service: General;  Laterality: N/A;  . MULTIPLE TOOTH EXTRACTIONS    . UMBILICAL HERNIA REPAIR N/A 10/19/2013   Procedure: UMBILICAL HERNIA REPAIR WITH MESH;  Surgeon: Ernestene Mention, MD;  Location: [facility];  Service: General;  Laterality: N/A;    reports that he has never smoked. He has never used smokeless tobacco. He reports that he does not drink alcohol and does not use drugs. family history includes Cancer in an other family member; Diabetes in an other family member; Heart disease in an other family member; Hypertension in an other family member. No Known Allergies Current Outpatient Medications on File Prior to Visit  Medication Sig Dispense Refill  . acetaminophen (TYLENOL) 650 MG CR tablet Take 650 mg by mouth every 8 (eight) hours as needed for pain.    Marland Kitchen aspirin 81 MG tablet Take 81 mg by mouth daily.      Marland Kitchen labetalol (NORMODYNE) 200 MG tablet Take 2 tablets (400 mg total) by mouth 2 (two) times daily. 360 tablet 3  . sildenafil (VIAGRA) 100 MG tablet TAKE 1/2 TO 1 TO TABLET BY MOUTH ONCE DAILY AS NEEDED FOR ERECTILE DYSFUNCTION 1 tablet 0  . telmisartan (MICARDIS) 80 MG tablet Take 1 tablet (80 mg total) by mouth daily. 90 tablet 3   No current facility-administered medications on file prior to visit.   Review of Systems All otherwise neg per pt    Objective:   Physical Exam BP (!) 144/92 (BP  Location: Left Arm)   Pulse 100   Temp 97.9 F (36.6 C) (Oral)   Wt 250 lb 9.6 oz (113.7 kg)   SpO2 98%   BMI 32.18 kg/m  VS noted,  Constitutional: Pt appears in NAD HENT: Head: NCAT.  Right Ear: External ear normal.  Left Ear: External ear normal.  Eyes: . Pupils are equal, round, and reactive to light. Conjunctivae and EOM are normal Nose: without d/c or deformity Neck: Neck supple. Gross normal ROM Cardiovascular: Normal rate and regular rhythm.   Pulmonary/Chest: Effort normal and breath sounds without rales or  wheezing.  Abd:  Soft, NT, ND, + BS, no organomegaly Neurological: Pt is alert. At baseline orientation, motor grossly intact Skin: Skin is warm. No rashes, other new lesions, no LE edema Psychiatric: Pt behavior is normal without agitation  All otherwise neg per pt Lab Results  Component Value Date   WBC 7.0 07/04/2019   HGB 14.4 07/04/2019   HCT 43.5 07/04/2019   PLT 255 07/04/2019   GLUCOSE 95 07/04/2019   CHOL 247 (H) 10/12/2018   TRIG 133.0 10/12/2018   HDL 54.50 10/12/2018   LDLDIRECT 156.1 09/23/2007   LDLCALC 166 (H) 10/12/2018   ALT 15 10/12/2018   AST 18 10/12/2018   NA 139 07/04/2019   K 3.8 07/04/2019   CL 105 07/04/2019   CREATININE 1.09 07/04/2019   BUN 12 07/04/2019   CO2 26 07/04/2019   TSH 0.914 07/05/2019   PSA 0.88 10/12/2018   HGBA1C 5.9 10/12/2018       Assessment & Plan:

## 2020-03-27 NOTE — Assessment & Plan Note (Signed)
stable overall by history and exam, recent data reviewed with pt, and pt to continue medical treatment as before,  to f/u any worsening symptoms or concerns  

## 2020-03-27 NOTE — Assessment & Plan Note (Signed)

## 2020-03-28 ENCOUNTER — Other Ambulatory Visit: Payer: Self-pay | Admitting: Internal Medicine

## 2020-03-28 ENCOUNTER — Encounter: Payer: Self-pay | Admitting: Internal Medicine

## 2020-03-28 LAB — COMPLETE METABOLIC PANEL WITH GFR
AG Ratio: 1.5 (calc) (ref 1.0–2.5)
ALT: 22 U/L (ref 9–46)
AST: 31 U/L (ref 10–35)
Albumin: 4.4 g/dL (ref 3.6–5.1)
Alkaline phosphatase (APISO): 81 U/L (ref 35–144)
BUN: 13 mg/dL (ref 7–25)
CO2: 30 mmol/L (ref 20–32)
Calcium: 9.4 mg/dL (ref 8.6–10.3)
Chloride: 101 mmol/L (ref 98–110)
Creat: 1.04 mg/dL (ref 0.70–1.25)
GFR, Est African American: 88 mL/min/{1.73_m2} (ref >=60)
GFR, Est Non African American: 76 mL/min/{1.73_m2} (ref >=60)
Globulin: 2.9 g/dL (calc) (ref 1.9–3.7)
Glucose, Bld: 103 mg/dL — ABNORMAL HIGH (ref 65–99)
Potassium: 4.2 mmol/L (ref 3.5–5.3)
Sodium: 137 mmol/L (ref 135–146)
Total Bilirubin: 0.6 mg/dL (ref 0.2–1.2)
Total Protein: 7.3 g/dL (ref 6.1–8.1)

## 2020-03-28 LAB — LIPID PANEL
Cholesterol: 260 mg/dL — ABNORMAL HIGH (ref <200)
HDL: 61 mg/dL (ref >=40)
LDL Cholesterol (Calc): 177 mg/dL (calc) — ABNORMAL HIGH
Non-HDL Cholesterol (Calc): 199 mg/dL (calc) — ABNORMAL HIGH (ref <130)
Total CHOL/HDL Ratio: 4.3 (calc) (ref <5.0)
Triglycerides: 99 mg/dL (ref <150)

## 2020-03-28 LAB — HEMOGLOBIN A1C
Hgb A1c MFr Bld: 5.7 % of total Hgb — ABNORMAL HIGH (ref <5.7)
Mean Plasma Glucose: 117 (calc)
eAG (mmol/L): 6.5 (calc)

## 2020-03-28 LAB — URINALYSIS, ROUTINE W REFLEX MICROSCOPIC
Bacteria, UA: NONE SEEN /HPF
Bilirubin Urine: NEGATIVE
Glucose, UA: NEGATIVE
Hyaline Cast: NONE SEEN /LPF
Ketones, ur: NEGATIVE
Leukocytes,Ua: NEGATIVE
Nitrite: NEGATIVE
Protein, ur: NEGATIVE
RBC / HPF: NONE SEEN /HPF (ref 0–2)
Specific Gravity, Urine: 1.013 (ref 1.001–1.03)
Squamous Epithelial / HPF: NONE SEEN /HPF (ref <=5)
WBC, UA: NONE SEEN /HPF (ref 0–5)
pH: 7 (ref 5.0–8.0)

## 2020-03-28 LAB — CBC WITH DIFFERENTIAL/PLATELET
Absolute Monocytes: 538 cells/uL (ref 200–950)
Basophils Absolute: 39 cells/uL (ref 0–200)
Basophils Relative: 0.7 %
Eosinophils Absolute: 11 cells/uL — ABNORMAL LOW (ref 15–500)
Eosinophils Relative: 0.2 %
HCT: 45 % (ref 38.5–50.0)
Hemoglobin: 14.9 g/dL (ref 13.2–17.1)
Lymphs Abs: 1490 cells/uL (ref 850–3900)
MCH: 29.6 pg (ref 27.0–33.0)
MCHC: 33.1 g/dL (ref 32.0–36.0)
MCV: 89.3 fL (ref 80.0–100.0)
MPV: 10.9 fL (ref 7.5–12.5)
Monocytes Relative: 9.6 %
Neutro Abs: 3522 cells/uL (ref 1500–7800)
Neutrophils Relative %: 62.9 %
Platelets: 281 10*3/uL (ref 140–400)
RBC: 5.04 10*6/uL (ref 4.20–5.80)
RDW: 13.3 % (ref 11.0–15.0)
Total Lymphocyte: 26.6 %
WBC: 5.6 10*3/uL (ref 3.8–10.8)

## 2020-03-28 LAB — VITAMIN D 25 HYDROXY (VIT D DEFICIENCY, FRACTURES): Vit D, 25-Hydroxy: 20 ng/mL — ABNORMAL LOW (ref 30–100)

## 2020-03-28 LAB — PSA: PSA: 0.7 ng/mL (ref <=4.0)

## 2020-03-28 LAB — VITAMIN B12: Vitamin B-12: 711 pg/mL (ref 200–1100)

## 2020-03-28 LAB — TSH: TSH: 1.42 mIU/L (ref 0.40–4.50)

## 2020-03-28 MED ORDER — VITAMIN D (ERGOCALCIFEROL) 1.25 MG (50000 UNIT) PO CAPS: 50000.0000 [IU] | ORAL_CAPSULE | ORAL | 0 refills | Status: DC

## 2020-04-25 MED ORDER — VITAMIN D (ERGOCALCIFEROL) 1.25 MG (50000 UNIT) PO CAPS
50000.0000 [IU] | ORAL_CAPSULE | ORAL | 0 refills | Status: DC
Start: 2020-04-25 — End: 2021-01-14

## 2020-04-25 MED ORDER — SILDENAFIL CITRATE 100 MG PO TABS: ORAL_TABLET | ORAL | 11 refills | Status: DC

## 2020-10-03 ENCOUNTER — Ambulatory Visit: Payer: 59 | Admitting: Internal Medicine

## 2020-10-30 ENCOUNTER — Ambulatory Visit: Payer: 59 | Admitting: Internal Medicine

## 2020-12-10 ENCOUNTER — Ambulatory Visit: Payer: 59 | Admitting: Internal Medicine

## 2020-12-28 ENCOUNTER — Telehealth: Payer: Self-pay | Admitting: Internal Medicine

## 2020-12-31 ENCOUNTER — Ambulatory Visit: Payer: 59 | Admitting: Internal Medicine

## 2021-01-14 ENCOUNTER — Other Ambulatory Visit: Payer: Self-pay

## 2021-01-14 ENCOUNTER — Other Ambulatory Visit: Payer: Self-pay | Admitting: Internal Medicine

## 2021-01-14 ENCOUNTER — Encounter: Payer: Self-pay | Admitting: Internal Medicine

## 2021-01-14 ENCOUNTER — Ambulatory Visit: Payer: 59 | Admitting: Internal Medicine

## 2021-01-14 ENCOUNTER — Other Ambulatory Visit (INDEPENDENT_AMBULATORY_CARE_PROVIDER_SITE_OTHER): Payer: 59

## 2021-01-14 VITALS — BP 166/90 | HR 98 | Temp 97.8°F | Ht 74.0 in | Wt 243.0 lb

## 2021-01-14 DIAGNOSIS — Z0001 Encounter for general adult medical examination with abnormal findings: Secondary | ICD-10-CM

## 2021-01-14 DIAGNOSIS — E559 Vitamin D deficiency, unspecified: Secondary | ICD-10-CM

## 2021-01-14 DIAGNOSIS — R7302 Impaired glucose tolerance (oral): Secondary | ICD-10-CM

## 2021-01-14 DIAGNOSIS — E7800 Pure hypercholesterolemia, unspecified: Secondary | ICD-10-CM

## 2021-01-14 DIAGNOSIS — I10 Essential (primary) hypertension: Secondary | ICD-10-CM

## 2021-01-14 LAB — LIPID PANEL
Cholesterol: 213 mg/dL — ABNORMAL HIGH (ref 0–200)
HDL: 47.6 mg/dL (ref >39.00)
LDL Cholesterol: 133 mg/dL — ABNORMAL HIGH (ref 0–99)
NonHDL: 165.16
Total CHOL/HDL Ratio: 4
Triglycerides: 160 mg/dL — ABNORMAL HIGH (ref 0.0–149.0)
VLDL: 32 mg/dL (ref 0.0–40.0)

## 2021-01-14 LAB — CBC WITH DIFFERENTIAL/PLATELET
Basophils Absolute: 0 10*3/uL (ref 0.0–0.1)
Basophils Relative: 0.5 % (ref 0.0–3.0)
Eosinophils Absolute: 0 10*3/uL (ref 0.0–0.7)
Eosinophils Relative: 0.7 % (ref 0.0–5.0)
HCT: 41.9 % (ref 39.0–52.0)
Hemoglobin: 14.1 g/dL (ref 13.0–17.0)
Lymphocytes Relative: 27 % (ref 12.0–46.0)
Lymphs Abs: 1.7 10*3/uL (ref 0.7–4.0)
MCHC: 33.6 g/dL (ref 30.0–36.0)
MCV: 89 fl (ref 78.0–100.0)
Monocytes Absolute: 0.7 10*3/uL (ref 0.1–1.0)
Monocytes Relative: 10.8 % (ref 3.0–12.0)
Neutro Abs: 3.7 10*3/uL (ref 1.4–7.7)
Neutrophils Relative %: 61 % (ref 43.0–77.0)
Platelets: 275 10*3/uL (ref 150.0–400.0)
RBC: 4.71 Mil/uL (ref 4.22–5.81)
RDW: 13.9 % (ref 11.5–15.5)
WBC: 6.1 10*3/uL (ref 4.0–10.5)

## 2021-01-14 LAB — HEPATIC FUNCTION PANEL
ALT: 14 U/L (ref 0–53)
AST: 18 U/L (ref 0–37)
Albumin: 4.4 g/dL (ref 3.5–5.2)
Alkaline Phosphatase: 77 U/L (ref 39–117)
Bilirubin, Direct: 0.1 mg/dL (ref 0.0–0.3)
Total Bilirubin: 0.4 mg/dL (ref 0.2–1.2)
Total Protein: 7.4 g/dL (ref 6.0–8.3)

## 2021-01-14 LAB — TSH: TSH: 2.26 u[IU]/mL (ref 0.35–4.50)

## 2021-01-14 LAB — BASIC METABOLIC PANEL
BUN: 12 mg/dL (ref 6–23)
CO2: 27 mEq/L (ref 19–32)
Calcium: 9.3 mg/dL (ref 8.4–10.5)
Chloride: 103 mEq/L (ref 96–112)
Creatinine, Ser: 1 mg/dL (ref 0.40–1.50)
GFR: 79.37 mL/min (ref >60.00)
Glucose, Bld: 102 mg/dL — ABNORMAL HIGH (ref 70–99)
Potassium: 4.1 mEq/L (ref 3.5–5.1)
Sodium: 139 mEq/L (ref 135–145)

## 2021-01-14 LAB — URINALYSIS, ROUTINE W REFLEX MICROSCOPIC
Bilirubin Urine: NEGATIVE
Ketones, ur: NEGATIVE
Leukocytes,Ua: NEGATIVE
Nitrite: NEGATIVE
Specific Gravity, Urine: 1.015 (ref 1.000–1.030)
Total Protein, Urine: NEGATIVE
Urine Glucose: NEGATIVE
Urobilinogen, UA: 0.2 (ref 0.0–1.0)
pH: 6 (ref 5.0–8.0)

## 2021-01-14 LAB — PSA: PSA: 0.91 ng/mL (ref 0.10–4.00)

## 2021-01-14 LAB — VITAMIN D 25 HYDROXY (VIT D DEFICIENCY, FRACTURES): VITD: 32.34 ng/mL (ref 30.00–100.00)

## 2021-01-14 LAB — HEMOGLOBIN A1C: Hgb A1c MFr Bld: 6.1 % (ref 4.6–6.5)

## 2021-01-14 MED ORDER — THERA-D 2000 50 MCG (2000 UT) PO TABS: ORAL_TABLET | ORAL | 99 refills | Status: AC

## 2021-01-14 MED ORDER — ROSUVASTATIN CALCIUM 20 MG PO TABS
20.0000 mg | ORAL_TABLET | Freq: Every day | ORAL | 3 refills | Status: DC
Start: 2021-01-14 — End: 2023-11-20

## 2021-01-14 MED ORDER — AMLODIPINE BESYLATE 10 MG PO TABS: 10.0000 mg | ORAL_TABLET | Freq: Every day | ORAL | 3 refills | Status: DC

## 2021-01-14 MED ORDER — TELMISARTAN 80 MG PO TABS: 80.0000 mg | ORAL_TABLET | Freq: Every day | ORAL | 3 refills | Status: DC

## 2021-01-14 NOTE — Assessment & Plan Note (Signed)
Pt with some confusion over which meds to continue since he has run out of refills, then didn't ask about it;  Ok to continue the amloidipine 10, and restart micardis 80 qd, f/u BP at home and next visit

## 2021-01-14 NOTE — Assessment & Plan Note (Signed)
Age and sex appropriate education and counseling updated with regular exercise and diet Referrals for preventative services - none needed Immunizations addressed - declines shingrix or  covid booster Smoking counseling  - none needed Evidence for depression or other mood disorder - none significant Most recent labs reviewed. I have personally reviewed and have noted: 1) the patient's medical and social history 2) The patient's current medications and supplements 3) The patient's height, weight, and BMI have been recorded in the chart

## 2021-01-14 NOTE — Assessment & Plan Note (Signed)
Lab Results  Component Value Date   HGBA1C 5.7 (H) 03/27/2020   Stable, pt to continue current medical treatment  - diet

## 2021-01-14 NOTE — Assessment & Plan Note (Signed)
Uncontrolled but working on much improved diet; goal ldl < 100, has been lipitor intolerant,but will consider crestor for elevated LDL

## 2021-01-14 NOTE — Assessment & Plan Note (Signed)
Has been taking vit d after low value last visit, to cont 2000 D3 qd and f/u lab Last vitamin D Lab Results  Component Value Date   VD25OH 20 (L) 03/27/2020

## 2021-01-14 NOTE — Progress Notes (Signed)
Patient ID: Peter Kent, male   DOB: Mar 30, 1956, 65 y.o.   MRN: [id number]         Chief Complaint:: wellness exam and Follow-up (7month follow up)  htn, hld, hyperglycemia, low vit d       HPI:  Peter Kent is a 65 y.o. male here for wellness exam; declines shingrix and covid booster for now; o/w up to date with preventive referrals and immunizations.                         Also muscle cramps resolved with stopping the statin.  Unfortuantely, he stopped all meds after last visit except for amlodipine and asa, just got confused it seems.  BP at home has been in the 140s sbp for the most part.  Pt denies chest pain, increased sob or doe, wheezing, orthopnea, PND, increased LE swelling, palpitations, dizziness or syncope.   Pt denies polydipsia, polyuria, or new focal neuro s/s  Conts to work diligently over the past year with real effort at exercise and diet with a goal wt loss to 225.   No new complaints  Wt Readings from Last 3 Encounters:  01/14/21 243 lb (110.2 kg)  03/27/20 250 lb 9.6 oz (113.7 kg)  07/14/19 271 lb (122.9 kg)   BP Readings from Last 3 Encounters:  01/14/21 (!) 166/90  03/27/20 (!) 144/92  07/14/19 140/88   Immunization History  Administered Date(s) Administered  . Influenza,inj,Quad PF,6+ Mos 08/16/2014, 09/30/2017, 07/05/2019  . PFIZER(Purple Top)SARS-COV-2 Vaccination 10/26/2019, 11/16/2019  . Td 03/26/2010  . Tdap 03/27/2020   There are no preventive care reminders to display for this patient.    Past Medical History:  Diagnosis Date  . ERECTILE DYSFUNCTION 04/08/2007  . Full dentures   . GERD (gastroesophageal reflux disease)   . HEAD TRAUMA, CLOSED 04/08/2007  . HYPERCHOLESTEROLEMIA 04/08/2007  . HYPERLIPIDEMIA 04/11/2007  . HYPERTENSION 04/08/2007  . Impaired glucose tolerance 07/05/2013  . Morbid obesity (HCC) 04/08/2007  . Preventative health care 03/22/2011  . Wears glasses    Past Surgical History:  Procedure Laterality Date  .  CIRCUMCISION    . INSERTION OF MESH N/A 10/19/2013   Procedure: INSERTION OF MESH;  Surgeon: Ernestene Mention, MD;  Location: [facility];  Service: General;  Laterality: N/A;  . MULTIPLE TOOTH EXTRACTIONS    . UMBILICAL HERNIA REPAIR N/A 10/19/2013   Procedure: UMBILICAL HERNIA REPAIR WITH MESH;  Surgeon: Ernestene Mention, MD;  Location: [facility];  Service: General;  Laterality: N/A;    reports that he has never smoked. He has never used smokeless tobacco. He reports that he does not drink alcohol and does not use drugs. family history includes Cancer in an other family member; Diabetes in an other family member; Heart disease in an other family member; Hypertension in an other family member. No Known Allergies Current Outpatient Medications on File Prior to Visit  Medication Sig Dispense Refill  . acetaminophen (TYLENOL) 650 MG CR tablet Take 650 mg by mouth every 8 (eight) hours as needed for pain.    Marland Kitchen aspirin 81 MG tablet Take 81 mg by mouth daily.    . sildenafil (VIAGRA) 100 MG tablet TAKE 1/2 TO 1 TO TABLET BY MOUTH ONCE DAILY AS NEEDED FOR ERECTILE DYSFUNCTION 5 tablet 11   No current facility-administered medications on file prior to visit.        ROS:  All others reviewed and  negative.  Objective        PE:  BP (!) 166/90 (BP Location: Left Arm, Patient Position: Sitting, Cuff Size: Large)   Pulse 98   Temp 97.8 F (36.6 C) (Oral)   Ht 6\' 2"  (1.88 m)   Wt 243 lb (110.2 kg)   SpO2 98%   BMI 31.20 kg/m                 Constitutional: Pt appears in NAD               HENT: Head: NCAT.                Right Ear: External ear normal.                 Left Ear: External ear normal.                Eyes: . Pupils are equal, round, and reactive to light. Conjunctivae and EOM are normal               Nose: without d/c or deformity               Neck: Neck supple. Gross normal ROM               Cardiovascular: Normal rate and regular rhythm.                  Pulmonary/Chest: Effort normal and breath sounds without rales or wheezing.                Abd:  Soft, NT, ND, + BS, no organomegaly               Neurological: Pt is alert. At baseline orientation, motor grossly intact               Skin: Skin is warm. No rashes, no other new lesions, LE edema - noe               Psychiatric: Pt behavior is normal without agitation   Micro: none  Cardiac tracings I have personally interpreted today:  none  Pertinent Radiological findings (summarize): none   Lab Results  Component Value Date   WBC 5.6 03/27/2020   HGB 14.9 03/27/2020   HCT 45.0 03/27/2020   PLT 281 03/27/2020   GLUCOSE 103 (H) 03/27/2020   CHOL 260 (H) 03/27/2020   TRIG 99 03/27/2020   HDL 61 03/27/2020   LDLDIRECT 156.1 09/23/2007   LDLCALC 177 (H) 03/27/2020   ALT 22 03/27/2020   AST 31 03/27/2020   NA 137 03/27/2020   K 4.2 03/27/2020   CL 101 03/27/2020   CREATININE 1.04 03/27/2020   BUN 13 03/27/2020   CO2 30 03/27/2020   TSH 1.42 03/27/2020   PSA 0.7 03/27/2020   HGBA1C 5.7 (H) 03/27/2020   Assessment/Plan:  Peter Kent is a 65 y.o. Black or African American [2] male with  has a past medical history of ERECTILE DYSFUNCTION (04/08/2007), Full dentures, GERD (gastroesophageal reflux disease), HEAD TRAUMA, CLOSED (04/08/2007), HYPERCHOLESTEROLEMIA (04/08/2007), HYPERLIPIDEMIA (04/11/2007), HYPERTENSION (04/08/2007), Impaired glucose tolerance (07/05/2013), Morbid obesity (HCC) (04/08/2007), Preventative health care (03/22/2011), and Wears glasses.  Encounter for well adult exam with abnormal findings Age and sex appropriate education and counseling updated with regular exercise and diet Referrals for preventative services - none needed Immunizations addressed - declines shingrix or  covid booster Smoking counseling  - none needed Evidence for depression or other mood disorder -  none significant Most recent labs reviewed. I have personally reviewed and have  noted: 1) the patient's medical and social history 2) The patient's current medications and supplements 3) The patient's height, weight, and BMI have been recorded in the chart   Hyperlipidemia Uncontrolled but working on much improved diet; goal ldl < 100, has been lipitor intolerant,but will consider crestor for elevated LDL  Hypertension, uncontrolled Pt with some confusion over which meds to continue since he has run out of refills, then didn't ask about it;  Ok to continue the amloidipine 10, and restart micardis 80 qd, f/u BP at home and next visit  Impaired glucose tolerance Lab Results  Component Value Date   HGBA1C 5.7 (H) 03/27/2020   Stable, pt to continue current medical treatment  - diet  Vitamin D deficiency Has been taking vit d after low value last visit, to cont 2000 D3 qd and f/u lab Last vitamin D Lab Results  Component Value Date   VD25OH 20 (L) 03/27/2020     Followup: Return in about 6 months (around 07/16/2021).  Oliver Barre, MD 01/14/2021 8:49 AM [facility]

## 2021-01-14 NOTE — Patient Instructions (Signed)
Ok to restart the micardis at 80 mg per day  Please continue all other medications as before, and refills have been done if requested.  Please have the pharmacy call with any other refills you may need.  Please continue your efforts at being more active, low cholesterol diet, and weight control.  You are otherwise up to date with prevention measures today.  Please keep your appointments with your specialists as you may have planned  Please go to the LAB at the blood drawing area for the tests to be done  You will be contacted by phone if any changes need to be made immediately.  Otherwise, you will receive a letter about your results with an explanation, but please check with MyChart first.  Please remember to sign up for MyChart if you have not done so, as this will be important to you in the future with finding out test results, communicating by private email, and scheduling acute appointments online when needed.  Please make an Appointment to return in 6 months, or sooner if needed, also with Lab Appointment for testing done 3-5 days before at the FIRST FLOOR Lab (so this is for TWO appointments - please see the scheduling desk as you leave)  Due to the ongoing Covid 19 pandemic, our lab now requires an appointment for any labs done at our office.  If you need labs done and do not have an appointment, please call our office ahead of time to schedule before presenting to the lab for your testing.

## 2021-06-03 ENCOUNTER — Emergency Department (HOSPITAL_COMMUNITY)
Admission: EM | Admit: 2021-06-03 | Discharge: 2021-06-03 | Disposition: A | Discharge status: Home / Self Care | Payer: Medicare Other | Attending: Emergency Medicine | Admitting: Emergency Medicine

## 2021-06-03 ENCOUNTER — Encounter (HOSPITAL_COMMUNITY): Payer: Self-pay

## 2021-06-03 ENCOUNTER — Other Ambulatory Visit: Payer: Self-pay

## 2021-06-03 DIAGNOSIS — T40725A Adverse effect of synthetic cannabinoids, initial encounter: Secondary | ICD-10-CM | POA: Diagnosis not present

## 2021-06-03 DIAGNOSIS — Z7982 Long term (current) use of aspirin: Secondary | ICD-10-CM | POA: Diagnosis not present

## 2021-06-03 DIAGNOSIS — I10 Essential (primary) hypertension: Secondary | ICD-10-CM | POA: Insufficient documentation

## 2021-06-03 DIAGNOSIS — Z79899 Other long term (current) drug therapy: Secondary | ICD-10-CM | POA: Diagnosis not present

## 2021-06-03 DIAGNOSIS — R030 Elevated blood-pressure reading, without diagnosis of hypertension: Secondary | ICD-10-CM | POA: Diagnosis present

## 2021-06-03 DIAGNOSIS — F419 Anxiety disorder, unspecified: Secondary | ICD-10-CM | POA: Diagnosis not present

## 2021-06-03 DIAGNOSIS — R350 Frequency of micturition: Secondary | ICD-10-CM | POA: Insufficient documentation

## 2021-06-03 LAB — BASIC METABOLIC PANEL
Anion gap: 9 (ref 5–15)
BUN: 18 mg/dL (ref 8–23)
CO2: 26 mmol/L (ref 22–32)
Calcium: 9 mg/dL (ref 8.9–10.3)
Chloride: 101 mmol/L (ref 98–111)
Creatinine, Ser: 0.97 mg/dL (ref 0.61–1.24)
GFR, Estimated: >60 mL/min (ref >60)
Glucose, Bld: 122 mg/dL — ABNORMAL HIGH (ref 70–99)
Potassium: 4.2 mmol/L (ref 3.5–5.1)
Sodium: 136 mmol/L (ref 135–145)

## 2021-06-03 LAB — CBC WITH DIFFERENTIAL/PLATELET
Abs Immature Granulocytes: 0.02 10*3/uL (ref 0.00–0.07)
Basophils Absolute: 0 10*3/uL (ref 0.0–0.1)
Basophils Relative: 0 %
Eosinophils Absolute: 0 10*3/uL (ref 0.0–0.5)
Eosinophils Relative: 0 %
HCT: 43.2 % (ref 39.0–52.0)
Hemoglobin: 14.5 g/dL (ref 13.0–17.0)
Immature Granulocytes: 0 %
Lymphocytes Relative: 16 %
Lymphs Abs: 0.9 10*3/uL (ref 0.7–4.0)
MCH: 30.3 pg (ref 26.0–34.0)
MCHC: 33.6 g/dL (ref 30.0–36.0)
MCV: 90.4 fL (ref 80.0–100.0)
Monocytes Absolute: 0.4 10*3/uL (ref 0.1–1.0)
Monocytes Relative: 7 %
Neutro Abs: 4.4 10*3/uL (ref 1.7–7.7)
Neutrophils Relative %: 77 %
Platelets: 255 10*3/uL (ref 150–400)
RBC: 4.78 MIL/uL (ref 4.22–5.81)
RDW: 13.3 % (ref 11.5–15.5)
WBC: 5.8 10*3/uL (ref 4.0–10.5)
nRBC: 0 % (ref 0.0–0.2)

## 2021-06-03 LAB — CBG MONITORING, ED: Glucose-Capillary: 115 mg/dL — ABNORMAL HIGH (ref 70–99)

## 2021-06-03 MED ORDER — LORAZEPAM 2 MG/ML IJ SOLN
1.0000 mg | Freq: Once | INTRAMUSCULAR | Status: AC
  Administered 2021-06-03: 1 mg via INTRAVENOUS
  Filled 2021-06-03: qty 1

## 2021-06-03 NOTE — ED Triage Notes (Signed)
Pt arrives with c/o HTN. 233/116 in triage.   Pt reports waking up feeling anxious and jittery. Thought it was from a delta 8 gummy he took for the first time. Compliant with antihypertensives. Denies headache or visual changes. NIH-0.

## 2021-06-03 NOTE — ED Provider Notes (Signed)
[facility]-EMERGENCY DEPT Provider Note: Lowella Dell, MD, FACEP  CSN: [id number] MRN: [id number] ARRIVAL: 06/03/21 at 0323 ROOM: WA13/WA13   CHIEF COMPLAINT  Hypertension   HISTORY OF PRESENT ILLNESS  06/03/21 4:20 AM Peter Kent is a 65 y.o. male who woke up feeling anxious and jittery this morning like he was "high".  He thought it was from taking delta 8 THC gummy for the first time.  He noticed his pressure was elevated on arrival it was 233/116.  He has been compliant with his antihypertensives.  He denies any pain or neurologic changes.  He has been having urinary frequency.   Past Medical History:  Diagnosis Date   ERECTILE DYSFUNCTION 04/08/2007   Full dentures    GERD (gastroesophageal reflux disease)    HEAD TRAUMA, CLOSED 04/08/2007   HYPERCHOLESTEROLEMIA 04/08/2007   HYPERLIPIDEMIA 04/11/2007   HYPERTENSION 04/08/2007   Impaired glucose tolerance 07/05/2013   Morbid obesity (HCC) 04/08/2007   Preventative health care 03/22/2011   Wears glasses     Past Surgical History:  Procedure Laterality Date   CIRCUMCISION     INSERTION OF MESH N/A 10/19/2013   Procedure: INSERTION OF MESH;  Surgeon: Ernestene Mention, MD;  Location: [facility];  Service: General;  Laterality: N/A;   MULTIPLE TOOTH EXTRACTIONS     UMBILICAL HERNIA REPAIR N/A 10/19/2013   Procedure: UMBILICAL HERNIA REPAIR WITH MESH;  Surgeon: Ernestene Mention, MD;  Location: [facility];  Service: General;  Laterality: N/A;    Family History  Problem Relation Age of Onset   Hypertension Other    Heart disease Other    Diabetes Other    Cancer Other        breast cancer   Colon cancer Neg Hx     Social History   Tobacco Use   Smoking status: Never   Smokeless tobacco: Never  Substance Use Topics   Alcohol use: No   Drug use: No    Prior to Admission medications   Medication Sig Start Date End Date Taking? Authorizing Provider  acetaminophen (TYLENOL) 650 MG CR  tablet Take 650 mg by mouth every 8 (eight) hours as needed for pain.   Yes [provider]  amLODipine (NORVASC) 10 MG tablet Take 1 tablet (10 mg total) by mouth daily. 01/14/21  Yes Corwin Levins, MD  aspirin 81 MG tablet Take 81 mg by mouth daily.   Yes [provider]  Cholecalciferol (THERA-D 2000) 50 MCG (2000 UT) TABS 1 tab by mouth once daily 01/14/21  Yes Corwin Levins, MD  rosuvastatin (CRESTOR) 20 MG tablet Take 1 tablet (20 mg total) by mouth daily. 01/14/21  Yes Corwin Levins, MD  telmisartan (MICARDIS) 80 MG tablet Take 1 tablet (80 mg total) by mouth daily. 01/14/21  Yes Corwin Levins, MD    Allergies Patient has no known allergies.   REVIEW OF SYSTEMS  Negative except as noted here or in the History of Present Illness.   PHYSICAL EXAMINATION  Initial Vital Signs Blood pressure (!) 233/116, pulse 70, temperature 97.8 F (36.6 C), temperature source Oral, resp. rate 18, height 6\' 3"  (1.905 m), weight 113.4 kg, SpO2 98 %.  Examination General: Well-developed, well-nourished male in no acute distress; appearance consistent with age of record HENT: normocephalic; atraumatic Eyes: pupils equal, round and reactive to light; extraocular muscles intact Neck: supple Heart: regular rate and rhythm Lungs: clear to auscultation bilaterally Abdomen: soft; nondistended; nontender; bowel  sounds present Extremities: No deformity; full range of motion; trace edema of lower legs Neurologic: Awake, alert and oriented; motor function intact in all extremities and symmetric; no facial droop Skin: Warm and dry Psychiatric: Normal mood and affect   RESULTS  Summary of this visit's results, reviewed and interpreted by myself:   EKG Interpretation  Date/Time:    Ventricular Rate:    PR Interval:    QRS Duration:   QT Interval:    QTC Calculation:   R Axis:     Text Interpretation:         Laboratory Studies: Results for orders placed or performed during the  hospital encounter of 06/03/21 (from the past 24 hour(s))  CBG monitoring, ED     Status: Abnormal   Collection Time: 06/03/21  5:03 AM  Result Value Ref Range   Glucose-Capillary 115 (H) 70 - 99 mg/dL  Basic metabolic panel     Status: Abnormal   Collection Time: 06/03/21  5:04 AM  Result Value Ref Range   Sodium 136 135 - 145 mmol/L   Potassium 4.2 3.5 - 5.1 mmol/L   Chloride 101 98 - 111 mmol/L   CO2 26 22 - 32 mmol/L   Glucose, Bld 122 (H) 70 - 99 mg/dL   BUN 18 8 - 23 mg/dL   Creatinine, Ser 9.38 0.61 - 1.24 mg/dL   Calcium 9.0 8.9 - 10.1 mg/dL   GFR, Estimated >75 >10 mL/min   Anion gap 9 5 - 15  CBC with Differential/Platelet     Status: None   Collection Time: 06/03/21  5:04 AM  Result Value Ref Range   WBC 5.8 4.0 - 10.5 K/uL   RBC 4.78 4.22 - 5.81 MIL/uL   Hemoglobin 14.5 13.0 - 17.0 g/dL   HCT 25.8 52.7 - 78.2 %   MCV 90.4 80.0 - 100.0 fL   MCH 30.3 26.0 - 34.0 pg   MCHC 33.6 30.0 - 36.0 g/dL   RDW 42.3 53.6 - 14.4 %   Platelets 255 150 - 400 K/uL   nRBC 0.0 0.0 - 0.2 %   Neutrophils Relative % 77 %   Neutro Abs 4.4 1.7 - 7.7 K/uL   Lymphocytes Relative 16 %   Lymphs Abs 0.9 0.7 - 4.0 K/uL   Monocytes Relative 7 %   Monocytes Absolute 0.4 0.1 - 1.0 K/uL   Eosinophils Relative 0 %   Eosinophils Absolute 0.0 0.0 - 0.5 K/uL   Basophils Relative 0 %   Basophils Absolute 0.0 0.0 - 0.1 K/uL   Immature Granulocytes 0 %   Abs Immature Granulocytes 0.02 0.00 - 0.07 K/uL   Imaging Studies: No results found.  ED COURSE and MDM  Nursing notes, initial and subsequent vitals signs, including pulse oximetry, reviewed and interpreted by myself.  Vitals:   06/03/21 0333 06/03/21 0435 06/03/21 0510 06/03/21 0530  BP: (!) 233/116 (!) 186/96 (!) 178/99 (!) 178/96  Pulse: 70 75 71 68  Resp: 18 16 16 16   Temp: 97.8 F (36.6 C)     TempSrc: Oral     SpO2: 98% 99% 100% 100%  Weight: 113.4 kg     Height: 6\' 3"  (1.905 m)      Medications  LORazepam (ATIVAN) injection 1  mg (1 mg Intravenous Given 06/03/21 0533)   5:52 AM Patient's blood pressure still somewhat elevated but he feels back to normal from a symptomatic point of view.  He was advised to avoid any synthetic nematocides in  the future.  We have seen multiple patients with adverse reactions to delta-8-THC recently.  Patient was vies to recheck his blood pressure tomorrow after the [facility] has had a chance to leave the system.  If it remains elevated he should contact his PCP.   PROCEDURES  Procedures   ED DIAGNOSES     ICD-10-CM   1. Adverse effect of synthetic cannabinoid, initial encounter  T40.725A     2. Hypertension not at goal  [facility], Jonny Ruiz, MD 06/03/21 [phone]

## 2021-07-16 ENCOUNTER — Ambulatory Visit: Payer: 59 | Admitting: Internal Medicine

## 2021-07-31 IMAGING — CT CT HEAD W/O CM
4 series · 17 of 47 positions shown, 19 images · non-contrast
Comparison: None.

CLINICAL DATA: Transient global amnesia. Altered mental status.

EXAM:
CT HEAD WITHOUT CONTRAST
TECHNIQUE: Contiguous axial images were obtained from the base of the skull
through the vertex without intravenous contrast.

[Series 3: head wo · axial · 0.43mm/px · z∈[+1162,+1302]mm · 7 of 38 slices shown, 9 images]
[im 5/38  brain]
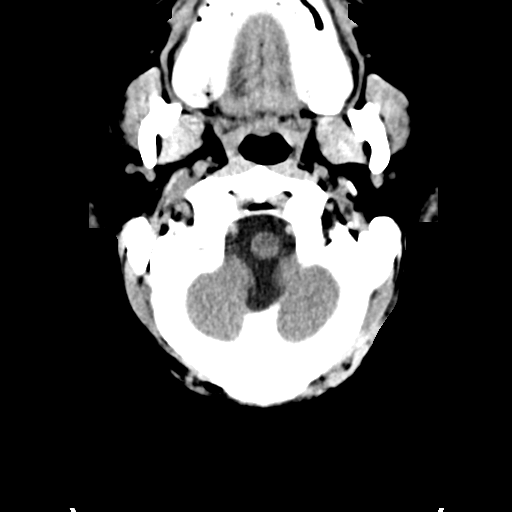
[im 5/38  bone]
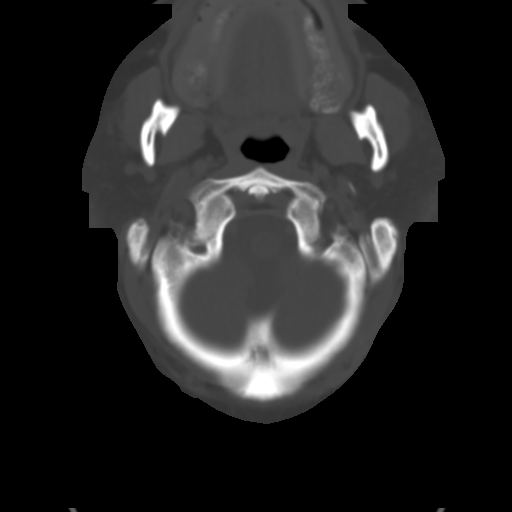
[im 10/38  brain]
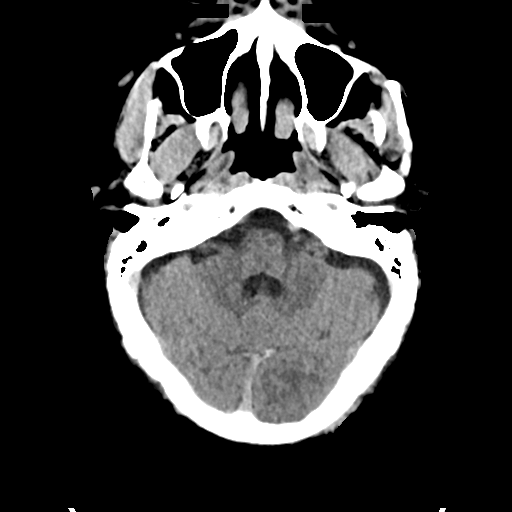
[im 14/38  brain]
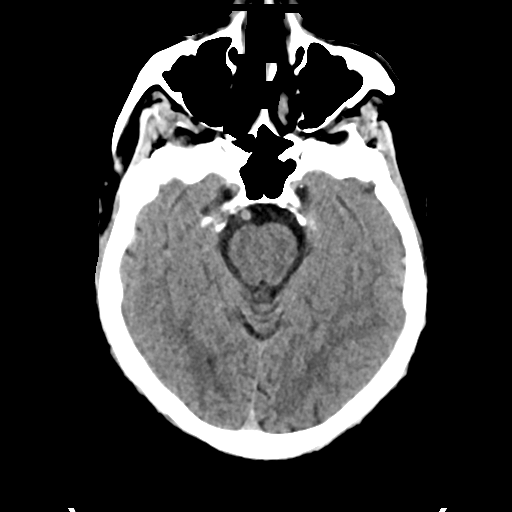
[im 19/38  brain]
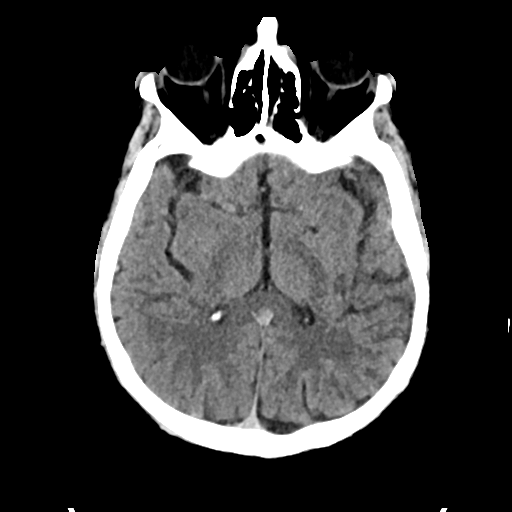
[im 24/38  brain]
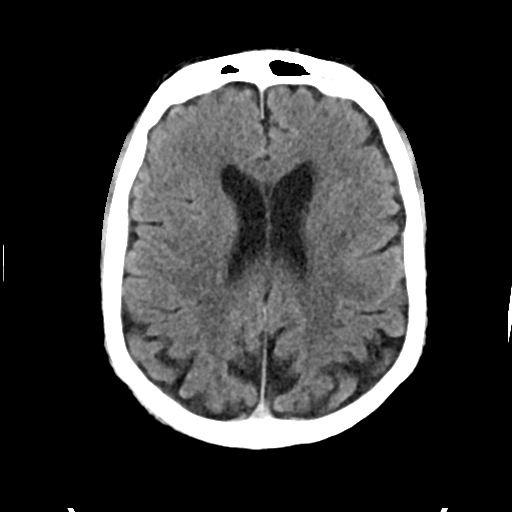
[im 24/38  bone]
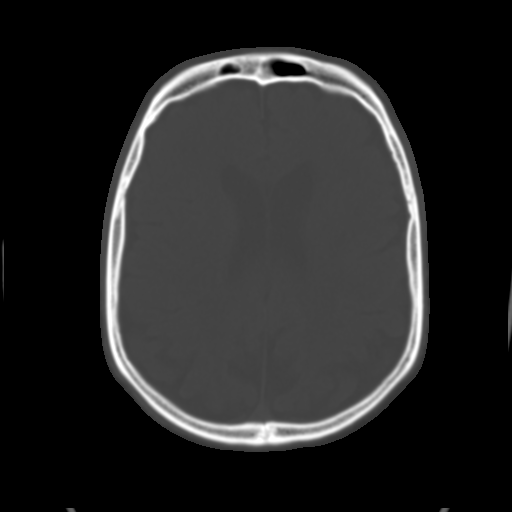
[im 28/38  brain]
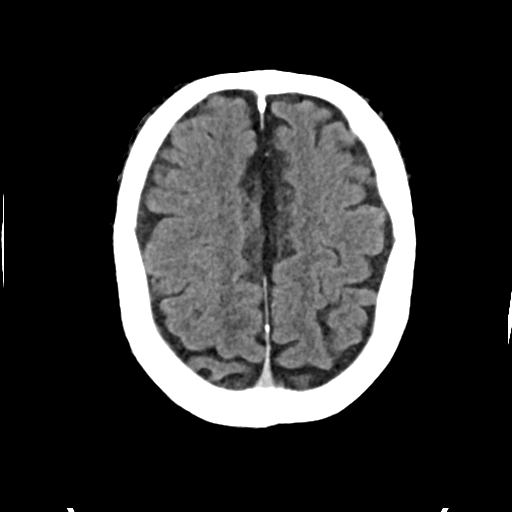
[im 33/38  brain]
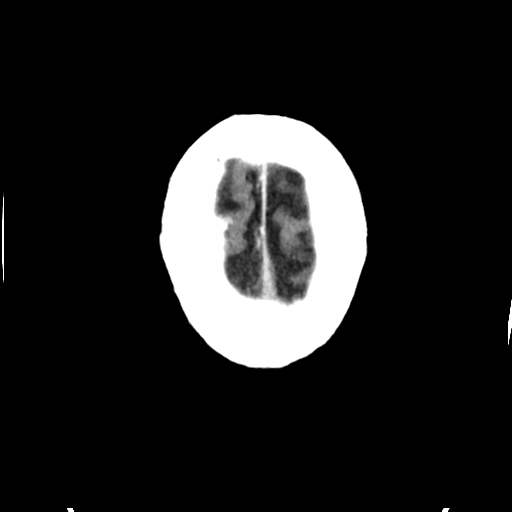

[Series 4: head bone · axial · 0.43mm/px · z∈[+1160,+1226]mm · 4 of 95 slices shown]
[im 10/95  bone]
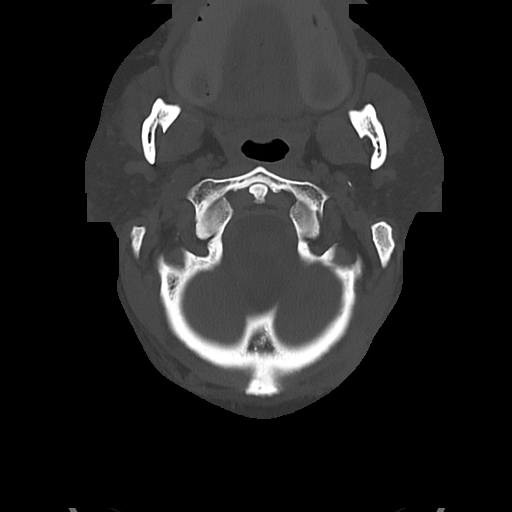
[im 19/95  bone]
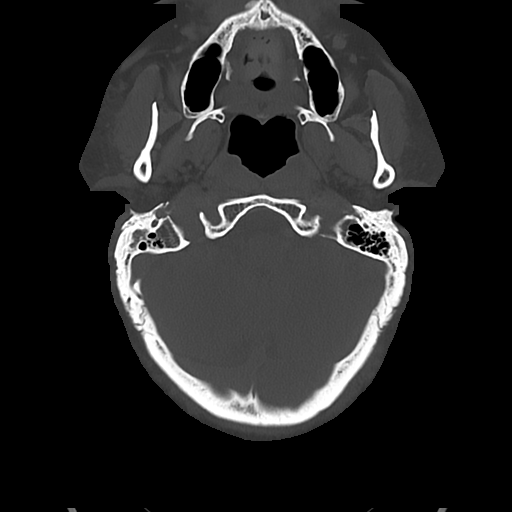
[im 29/95  bone]
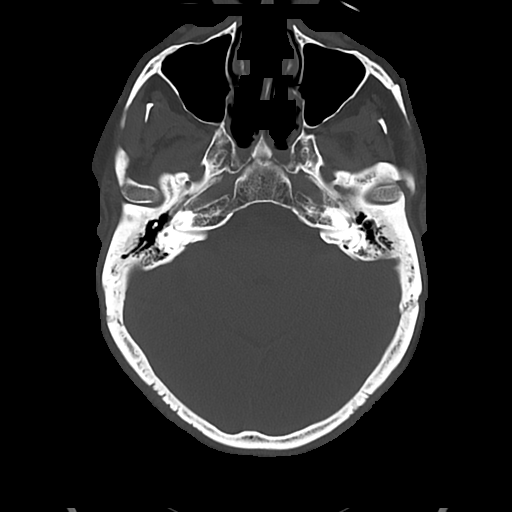
[im 43/95  bone]
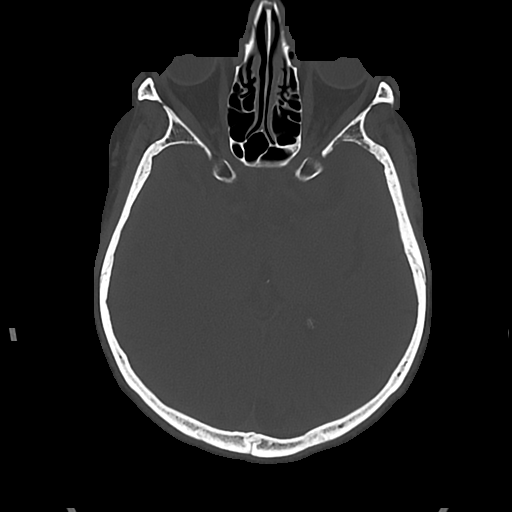

[Series 5: cor soft · coronal · 0.36mm/px · 3 of 25 slices shown]
[im 9/25  brain]
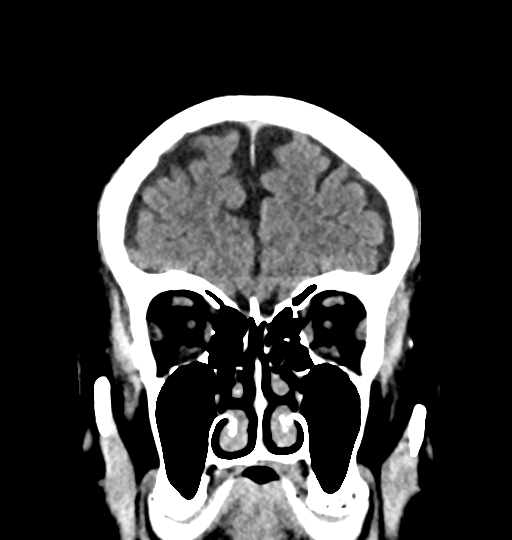
[im 11/25  brain]
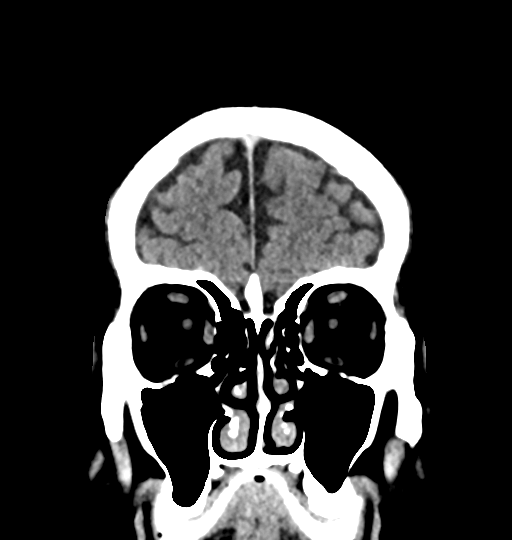
[im 14/25  brain]
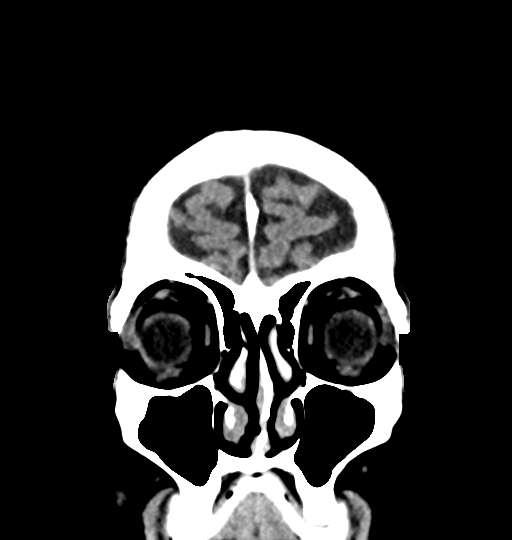

[Series 6: sag soft · sagittal · 0.40mm/px · 3 of 62 slices shown]
[im 21/62  brain]
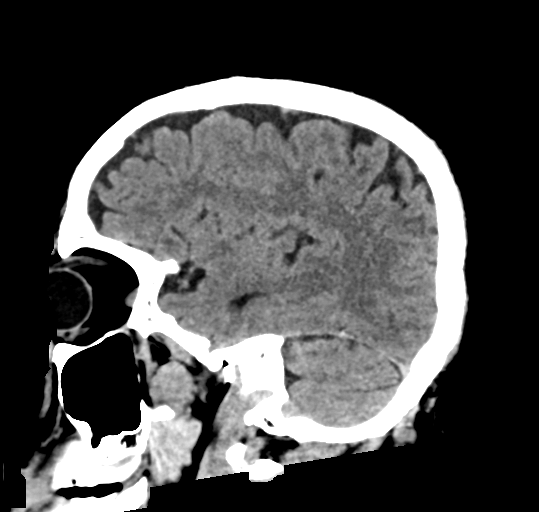
[im 31/62  brain]
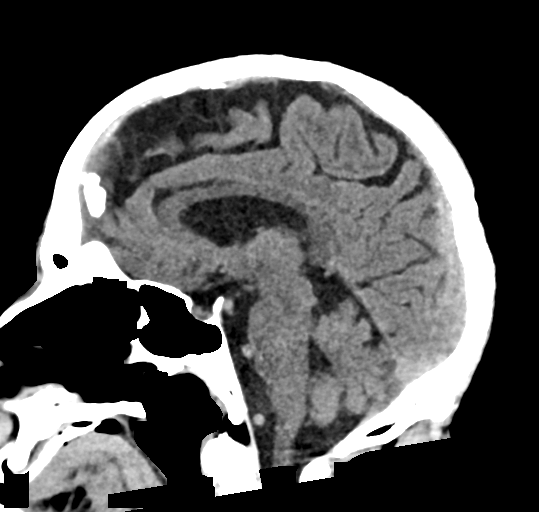
[im 41/62  brain]
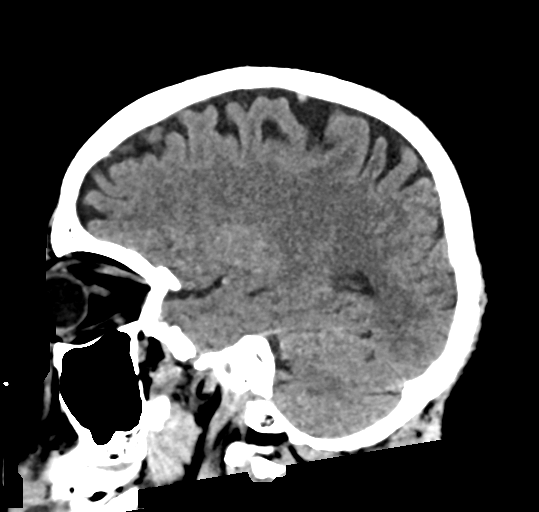

[17 of 47 positions shown; findings below may reference images not displayed]

FINDINGS: Brain: No evidence of acute infarction, hemorrhage, hydrocephalus,
extra-axial collection or mass lesion/mass effect. Diffuse mild
cerebral cortical atrophy.

Vascular: No hyperdense vessel or unexpected calcification.

Skull: Normal. Negative for fracture or focal lesion.

Sinuses/Orbits: Normal.

Other: None
IMPRESSION: No acute abnormalities. Diffuse mild cerebral cortical atrophy.

## 2021-10-11 ENCOUNTER — Ambulatory Visit: Payer: 59 | Admitting: Internal Medicine

## 2022-01-10 ENCOUNTER — Ambulatory Visit: Payer: 59

## 2022-01-10 ENCOUNTER — Ambulatory Visit: Payer: 59 | Admitting: Internal Medicine

## 2022-01-27 ENCOUNTER — Encounter: Payer: Self-pay | Admitting: Internal Medicine

## 2022-01-27 ENCOUNTER — Ambulatory Visit (INDEPENDENT_AMBULATORY_CARE_PROVIDER_SITE_OTHER): Payer: Medicare Other | Admitting: Internal Medicine

## 2022-01-27 VITALS — BP 160/100 | HR 91 | Temp 97.7°F | Ht 75.0 in | Wt 264.0 lb

## 2022-01-27 DIAGNOSIS — R7302 Impaired glucose tolerance (oral): Secondary | ICD-10-CM

## 2022-01-27 DIAGNOSIS — Z125 Encounter for screening for malignant neoplasm of prostate: Secondary | ICD-10-CM | POA: Diagnosis not present

## 2022-01-27 DIAGNOSIS — Z23 Encounter for immunization: Secondary | ICD-10-CM | POA: Diagnosis not present

## 2022-01-27 DIAGNOSIS — I10 Essential (primary) hypertension: Secondary | ICD-10-CM

## 2022-01-27 DIAGNOSIS — E538 Deficiency of other specified B group vitamins: Secondary | ICD-10-CM

## 2022-01-27 DIAGNOSIS — E559 Vitamin D deficiency, unspecified: Secondary | ICD-10-CM | POA: Diagnosis not present

## 2022-01-27 DIAGNOSIS — Z0001 Encounter for general adult medical examination with abnormal findings: Secondary | ICD-10-CM

## 2022-01-27 DIAGNOSIS — E7800 Pure hypercholesterolemia, unspecified: Secondary | ICD-10-CM | POA: Diagnosis not present

## 2022-01-27 LAB — URINALYSIS, ROUTINE W REFLEX MICROSCOPIC
Bilirubin Urine: NEGATIVE
Ketones, ur: NEGATIVE
Leukocytes,Ua: NEGATIVE
Nitrite: NEGATIVE
Specific Gravity, Urine: 1.015 (ref 1.000–1.030)
Total Protein, Urine: NEGATIVE
Urine Glucose: NEGATIVE
Urobilinogen, UA: 0.2 (ref 0.0–1.0)
pH: 6 (ref 5.0–8.0)

## 2022-01-27 LAB — LIPID PANEL
Cholesterol: 257 mg/dL — ABNORMAL HIGH (ref 0–200)
HDL: 57 mg/dL (ref >39.00)
LDL Cholesterol: 181 mg/dL — ABNORMAL HIGH (ref 0–99)
NonHDL: 200.34
Total CHOL/HDL Ratio: 5
Triglycerides: 97 mg/dL (ref 0.0–149.0)
VLDL: 19.4 mg/dL (ref 0.0–40.0)

## 2022-01-27 LAB — CBC WITH DIFFERENTIAL/PLATELET
Basophils Absolute: 0.1 10*3/uL (ref 0.0–0.1)
Basophils Relative: 1 % (ref 0.0–3.0)
Eosinophils Absolute: 0 10*3/uL (ref 0.0–0.7)
Eosinophils Relative: 0.1 % (ref 0.0–5.0)
HCT: 42.7 % (ref 39.0–52.0)
Hemoglobin: 14.2 g/dL (ref 13.0–17.0)
Lymphocytes Relative: 30.5 % (ref 12.0–46.0)
Lymphs Abs: 1.8 10*3/uL (ref 0.7–4.0)
MCHC: 33.3 g/dL (ref 30.0–36.0)
MCV: 90 fl (ref 78.0–100.0)
Monocytes Absolute: 0.6 10*3/uL (ref 0.1–1.0)
Monocytes Relative: 10.7 % (ref 3.0–12.0)
Neutro Abs: 3.5 10*3/uL (ref 1.4–7.7)
Neutrophils Relative %: 57.7 % (ref 43.0–77.0)
Platelets: 247 10*3/uL (ref 150.0–400.0)
RBC: 4.74 Mil/uL (ref 4.22–5.81)
RDW: 14.3 % (ref 11.5–15.5)
WBC: 6 10*3/uL (ref 4.0–10.5)

## 2022-01-27 LAB — VITAMIN D 25 HYDROXY (VIT D DEFICIENCY, FRACTURES): VITD: 50.39 ng/mL (ref 30.00–100.00)

## 2022-01-27 LAB — BASIC METABOLIC PANEL
BUN: 16 mg/dL (ref 6–23)
CO2: 27 mEq/L (ref 19–32)
Calcium: 9.5 mg/dL (ref 8.4–10.5)
Chloride: 101 mEq/L (ref 96–112)
Creatinine, Ser: 1.09 mg/dL (ref 0.40–1.50)
GFR: 71.05 mL/min (ref >60.00)
Glucose, Bld: 83 mg/dL (ref 70–99)
Potassium: 4.1 mEq/L (ref 3.5–5.1)
Sodium: 135 mEq/L (ref 135–145)

## 2022-01-27 LAB — HEPATIC FUNCTION PANEL
ALT: 17 U/L (ref 0–53)
AST: 24 U/L (ref 0–37)
Albumin: 4.6 g/dL (ref 3.5–5.2)
Alkaline Phosphatase: 85 U/L (ref 39–117)
Bilirubin, Direct: 0.1 mg/dL (ref 0.0–0.3)
Total Bilirubin: 0.7 mg/dL (ref 0.2–1.2)
Total Protein: 7.7 g/dL (ref 6.0–8.3)

## 2022-01-27 LAB — HEMOGLOBIN A1C: Hgb A1c MFr Bld: 6.1 % (ref 4.6–6.5)

## 2022-01-27 LAB — TSH: TSH: 1.38 u[IU]/mL (ref 0.35–5.50)

## 2022-01-27 LAB — PSA: PSA: 0.93 ng/mL (ref 0.10–4.00)

## 2022-01-27 LAB — VITAMIN B12: Vitamin B-12: 549 pg/mL (ref 211–911)

## 2022-01-27 NOTE — Patient Instructions (Signed)
You had the Prevnar 20 pneumonia shot today  Please continue all other medications as before, and refills have been done if requested.  Please have the pharmacy call with any other refills you may need.  Please continue your efforts at being more active, low cholesterol diet, and weight control.  You are otherwise up to date with prevention measures today.  Please keep your appointments with your specialists as you may have planned  Please go to the LAB at the blood drawing area for the tests to be done  You will be contacted by phone if any changes need to be made immediately.  Otherwise, you will receive a letter about your results with an explanation, but please check with MyChart first.  Please remember to sign up for MyChart if you have not done so, as this will be important to you in the future with finding out test results, communicating by private email, and scheduling acute appointments online when needed.  Please make an Appointment to return in 6 months, or sooner if needed, to recheck the BP

## 2022-01-27 NOTE — Assessment & Plan Note (Signed)
Last vitamin D Lab Results  Component Value Date   VD25OH 32.34 01/14/2021   Low, to start oral replacement

## 2022-01-27 NOTE — Progress Notes (Signed)
Patient ID: Peter Kent, male   DOB: June 10, 1956, 66 y.o.   MRN: [id number]         Chief Complaint:: wellness exam and htn, low vit d, hyperglycemia, hld       HPI:  Peter Kent is a 66 y.o. male here for wellness exam; declines shingrix, due for prevnar 20, o/w up to date                         Also has been out of micardis for several days.  Pt denies chest pain, increased sob or doe, wheezing, orthopnea, PND, increased LE swelling, palpitations, dizziness or syncope.   Pt denies polydipsia, polyuria, or new focal neuro s/s.    Pt denies fever, wt loss, night sweats, loss of appetite, or other constitutional symptoms     Wt Readings from Last 3 Encounters:  01/27/22 264 lb (119.7 kg)  06/03/21 250 lb (113.4 kg)  01/14/21 243 lb (110.2 kg)   BP Readings from Last 3 Encounters:  01/27/22 (!) 160/100  06/03/21 (!) 178/96  01/14/21 (!) 166/90   Immunization History  Administered Date(s) Administered   Influenza,inj,Quad PF,6+ Mos 08/16/2014, 09/30/2017, 07/05/2019   PFIZER(Purple Top)SARS-COV-2 Vaccination 10/26/2019, 11/16/2019   PNEUMOCOCCAL CONJUGATE-20 01/27/2022   Td 03/26/2010   Tdap 03/27/2020   There are no preventive care reminders to display for this patient.     Past Medical History:  Diagnosis Date   ERECTILE DYSFUNCTION 04/08/2007   Full dentures    GERD (gastroesophageal reflux disease)    HEAD TRAUMA, CLOSED 04/08/2007   HYPERCHOLESTEROLEMIA 04/08/2007   HYPERLIPIDEMIA 04/11/2007   HYPERTENSION 04/08/2007   Impaired glucose tolerance 07/05/2013   Morbid obesity (HCC) 04/08/2007   Preventative health care 03/22/2011   Wears glasses    Past Surgical History:  Procedure Laterality Date   CIRCUMCISION     INSERTION OF MESH N/A 10/19/2013   Procedure: INSERTION OF MESH;  Surgeon: Ernestene Mention, MD;  Location: [facility];  Service: General;  Laterality: N/A;   MULTIPLE TOOTH EXTRACTIONS     UMBILICAL HERNIA REPAIR N/A 10/19/2013    Procedure: UMBILICAL HERNIA REPAIR WITH MESH;  Surgeon: Ernestene Mention, MD;  Location: [facility];  Service: General;  Laterality: N/A;    reports that he has never smoked. He has never used smokeless tobacco. He reports that he does not drink alcohol and does not use drugs. family history includes Cancer in an other family member; Diabetes in an other family member; Heart disease in an other family member; Hypertension in an other family member. No Known Allergies Current Outpatient Medications on File Prior to Visit  Medication Sig Dispense Refill   acetaminophen (TYLENOL) 650 MG CR tablet Take 650 mg by mouth every 8 (eight) hours as needed for pain.     amLODipine (NORVASC) 10 MG tablet Take 1 tablet (10 mg total) by mouth daily. 90 tablet 3   aspirin 81 MG tablet Take 81 mg by mouth daily.     Cholecalciferol (THERA-D 2000) 50 MCG (2000 UT) TABS 1 tab by mouth once daily 30 tablet 99   rosuvastatin (CRESTOR) 20 MG tablet Take 1 tablet (20 mg total) by mouth daily. 90 tablet 3   telmisartan (MICARDIS) 80 MG tablet Take 1 tablet (80 mg total) by mouth daily. (Patient not taking: Reported on 01/27/2022) 90 tablet 3   No current facility-administered medications on file prior to visit.  ROS:  All others reviewed and negative.  Objective        PE:  BP (!) 160/100 (BP Location: Right Arm, Patient Position: Sitting, Cuff Size: Large)   Pulse 91   Temp 97.7 F (36.5 C) (Oral)   Ht 6\' 3"  (1.905 m)   Wt 264 lb (119.7 kg)   SpO2 100%   BMI 33.00 kg/m                 Constitutional: Pt appears in NAD               HENT: Head: NCAT.                Right Ear: External ear normal.                 Left Ear: External ear normal.                Eyes: . Pupils are equal, round, and reactive to light. Conjunctivae and EOM are normal               Nose: without d/c or deformity               Neck: Neck supple. Gross normal ROM               Cardiovascular: Normal rate and  regular rhythm.                 Pulmonary/Chest: Effort normal and breath sounds without rales or wheezing.                Abd:  Soft, NT, ND, + BS, no organomegaly               Neurological: Pt is alert. At baseline orientation, motor grossly intact               Skin: Skin is warm. No rashes, no other new lesions, LE edema - trace pedal bilateral               Psychiatric: Pt behavior is normal without agitation   Micro: none  Cardiac tracings I have personally interpreted today:  none  Pertinent Radiological findings (summarize): none   Lab Results  Component Value Date   WBC 6.0 01/27/2022   HGB 14.2 01/27/2022   HCT 42.7 01/27/2022   PLT 247.0 01/27/2022   GLUCOSE 83 01/27/2022   CHOL 257 (H) 01/27/2022   TRIG 97.0 01/27/2022   HDL 57.00 01/27/2022   LDLDIRECT 156.1 09/23/2007   LDLCALC 181 (H) 01/27/2022   ALT 17 01/27/2022   AST 24 01/27/2022   NA 135 01/27/2022   K 4.1 01/27/2022   CL 101 01/27/2022   CREATININE 1.09 01/27/2022   BUN 16 01/27/2022   CO2 27 01/27/2022   TSH 1.38 01/27/2022   PSA 0.93 01/27/2022   HGBA1C 6.1 01/27/2022   Assessment/Plan:  Peter Kent is a 66 y.o. Black or African American [2] male with  has a past medical history of ERECTILE DYSFUNCTION (04/08/2007), Full dentures, GERD (gastroesophageal reflux disease), HEAD TRAUMA, CLOSED (04/08/2007), HYPERCHOLESTEROLEMIA (04/08/2007), HYPERLIPIDEMIA (04/11/2007), HYPERTENSION (04/08/2007), Impaired glucose tolerance (07/05/2013), Morbid obesity (HCC) (04/08/2007), Preventative health care (03/22/2011), and Wears glasses.  Vitamin D deficiency Last vitamin D Lab Results  Component Value Date   VD25OH 32.34 01/14/2021   Low, to start oral replacement   Encounter for well adult exam with abnormal findings Age and sex appropriate education and counseling updated with regular exercise  and diet Referrals for preventative services - none needed Immunizations addressed - for prevnar 20, declines  shingrix Smoking counseling  - none needed Evidence for depression or other mood disorder - none significant Most recent labs reviewed. I have personally reviewed and have noted: 1) the patient's medical and social history 2) The patient's current medications and supplements 3) The patient's height, weight, and BMI have been recorded in the chart   Impaired glucose tolerance Lab Results  Component Value Date   HGBA1C 6.1 01/27/2022   Stable, pt to continue current medical treatment  - diet ,wt control, excercise   Hypertension, uncontrolled BP Readings from Last 3 Encounters:  01/27/22 (!) 160/100  06/03/21 (!) 178/96  01/14/21 (!) 166/90   Uncontrolled, to restart micardis, f/u bp at home and nexxt visit, pt to continue medical treatment micardis 80 mg norvasc 10 mg qd   Hyperlipidemia Lab Results  Component Value Date   LDLCALC 181 (H) 01/27/2022   Severe uncontrolled, , pt to start crestor 20 mg qd  Followup: Return in about 6 months (around 07/29/2022).  Oliver Barre, MD 01/29/2022 10:07 PM [facility]

## 2022-01-29 ENCOUNTER — Encounter: Payer: Self-pay | Admitting: Internal Medicine

## 2022-01-29 NOTE — Assessment & Plan Note (Signed)
Age and sex appropriate education and counseling updated with regular exercise and diet Referrals for preventative services - none needed Immunizations addressed - for prevnar 20, declines shingrix Smoking counseling  - none needed Evidence for depression or other mood disorder - none significant Most recent labs reviewed. I have personally reviewed and have noted: 1) the patient's medical and social history 2) The patient's current medications and supplements 3) The patient's height, weight, and BMI have been recorded in the chart

## 2022-01-29 NOTE — Assessment & Plan Note (Signed)
Lab Results  Component Value Date   HGBA1C 6.1 01/27/2022   Stable, pt to continue current medical treatment  - diet ,wt control, excercise

## 2022-01-29 NOTE — Assessment & Plan Note (Signed)
BP Readings from Last 3 Encounters:  01/27/22 (!) 160/100  06/03/21 (!) 178/96  01/14/21 (!) 166/90   Uncontrolled, to restart micardis, f/u bp at home and nexxt visit, pt to continue medical treatment micardis 80 mg norvasc 10 mg qd

## 2022-01-29 NOTE — Assessment & Plan Note (Signed)
Lab Results  Component Value Date   LDLCALC 181 (H) 01/27/2022   Severe uncontrolled, , pt to start crestor 20 mg qd

## 2022-03-26 ENCOUNTER — Ambulatory Visit: Payer: 59 | Admitting: Internal Medicine

## 2022-03-28 ENCOUNTER — Ambulatory Visit (INDEPENDENT_AMBULATORY_CARE_PROVIDER_SITE_OTHER): Payer: Medicare Other

## 2022-03-28 DIAGNOSIS — Z0000 Encounter for general adult medical examination without abnormal findings: Secondary | ICD-10-CM | POA: Diagnosis not present

## 2022-03-28 NOTE — Patient Instructions (Signed)
Peter Kent , Thank you for taking time to come for your Medicare Wellness Visit. I appreciate your ongoing commitment to your health goals. Please review the following plan we discussed and let me know if I can assist you in the future.   Screening recommendations/referrals: Colonoscopy: 11/21/2014 Recommended yearly ophthalmology/optometry visit for glaucoma screening and checkup Recommended yearly dental visit for hygiene and checkup  Vaccinations: Influenza vaccine: completed  Pneumococcal vaccine: completed  Tdap vaccine: 03/27/2020 Shingles vaccine: will consider     Advanced directives: none   Conditions/risks identified: none   Next appointment: none   Preventive Care 65 Years and Older, Male Preventive care refers to lifestyle choices and visits with your health care provider that can promote health and wellness. What does preventive care include? A yearly physical exam. This is also called an annual well check. Dental exams once or twice a year. Routine eye exams. Ask your health care provider how often you should have your eyes checked. Personal lifestyle choices, including: Daily care of your teeth and gums. Regular physical activity. Eating a healthy diet. Avoiding tobacco and drug use. Limiting alcohol use. Practicing safe sex. Taking low doses of aspirin every day. Taking vitamin and mineral supplements as recommended by your health care provider. What happens during an annual well check? The services and screenings done by your health care provider during your annual well check will depend on your age, overall health, lifestyle risk factors, and family history of disease. Counseling  Your health care provider may ask you questions about your: Alcohol use. Tobacco use. Drug use. Emotional well-being. Home and relationship well-being. Sexual activity. Eating habits. History of falls. Memory and ability to understand (cognition). Work and work  Astronomer. Screening  You may have the following tests or measurements: Height, weight, and BMI. Blood pressure. Lipid and cholesterol levels. These may be checked every 5 years, or more frequently if you are over 48 years old. Skin check. Lung cancer screening. You may have this screening every year starting at age 71 if you have a 30-pack-year history of smoking and currently smoke or have quit within the past 15 years. Fecal occult blood test (FOBT) of the stool. You may have this test every year starting at age 20. Flexible sigmoidoscopy or colonoscopy. You may have a sigmoidoscopy every 5 years or a colonoscopy every 10 years starting at age 62. Prostate cancer screening. Recommendations will vary depending on your family history and other risks. Hepatitis C blood test. Hepatitis B blood test. Sexually transmitted disease (STD) testing. Diabetes screening. This is done by checking your blood sugar (glucose) after you have not eaten for a while (fasting). You may have this done every 1-3 years. Abdominal aortic aneurysm (AAA) screening. You may need this if you are a current or former smoker. Osteoporosis. You may be screened starting at age 82 if you are at high risk. Talk with your health care provider about your test results, treatment options, and if necessary, the need for more tests. Vaccines  Your health care provider may recommend certain vaccines, such as: Influenza vaccine. This is recommended every year. Tetanus, diphtheria, and acellular pertussis (Tdap, Td) vaccine. You may need a Td booster every 10 years. Zoster vaccine. You may need this after age 39. Pneumococcal 13-valent conjugate (PCV13) vaccine. One dose is recommended after age 64. Pneumococcal polysaccharide (PPSV23) vaccine. One dose is recommended after age 95. Talk to your health care provider about which screenings and vaccines you need and how often you  need them. This information is not intended to replace  advice given to you by your health care provider. Make sure you discuss any questions you have with your health care provider. Document Released: 08/24/2015 Document Revised: 04/16/2016 Document Reviewed: 05/29/2015 Elsevier Interactive Patient Education  2017 [employer] Prevention in the Home Falls can cause injuries. They can happen to people of all ages. There are many things you can do to make your home safe and to help prevent falls. What can I do on the outside of my home? Regularly fix the edges of walkways and driveways and fix any cracks. Remove anything that might make you trip as you walk through a door, such as a raised step or threshold. Trim any bushes or trees on the path to your home. Use bright outdoor lighting. Clear any walking paths of anything that might make someone trip, such as rocks or tools. Regularly check to see if handrails are loose or broken. Make sure that both sides of any steps have handrails. Any raised decks and porches should have guardrails on the edges. Have any leaves, snow, or ice cleared regularly. Use sand or salt on walking paths during winter. Clean up any spills in your garage right away. This includes oil or grease spills. What can I do in the bathroom? Use night lights. Install grab bars by the toilet and in the tub and shower. Do not use towel bars as grab bars. Use non-skid mats or decals in the tub or shower. If you need to sit down in the shower, use a plastic, non-slip stool. Keep the floor dry. Clean up any water that spills on the floor as soon as it happens. Remove soap buildup in the tub or shower regularly. Attach bath mats securely with double-sided non-slip rug tape. Do not have throw rugs and other things on the floor that can make you trip. What can I do in the bedroom? Use night lights. Make sure that you have a light by your bed that is easy to reach. Do not use any sheets or blankets that are too big for your bed.  They should not hang down onto the floor. Have a firm chair that has side arms. You can use this for support while you get dressed. Do not have throw rugs and other things on the floor that can make you trip. What can I do in the kitchen? Clean up any spills right away. Avoid walking on wet floors. Keep items that you use a lot in easy-to-reach places. If you need to reach something above you, use a strong step stool that has a grab bar. Keep electrical cords out of the way. Do not use floor polish or wax that makes floors slippery. If you must use wax, use non-skid floor wax. Do not have throw rugs and other things on the floor that can make you trip. What can I do with my stairs? Do not leave any items on the stairs. Make sure that there are handrails on both sides of the stairs and use them. Fix handrails that are broken or loose. Make sure that handrails are as long as the stairways. Check any carpeting to make sure that it is firmly attached to the stairs. Fix any carpet that is loose or worn. Avoid having throw rugs at the top or bottom of the stairs. If you do have throw rugs, attach them to the floor with carpet tape. Make sure that you have a light switch  at the top of the stairs and the bottom of the stairs. If you do not have them, ask someone to add them for you. What else can I do to help prevent falls? Wear shoes that: Do not have high heels. Have rubber bottoms. Are comfortable and fit you well. Are closed at the toe. Do not wear sandals. If you use a stepladder: Make sure that it is fully opened. Do not climb a closed stepladder. Make sure that both sides of the stepladder are locked into place. Ask someone to hold it for you, if possible. Clearly mark and make sure that you can see: Any grab bars or handrails. First and last steps. Where the edge of each step is. Use tools that help you move around (mobility aids) if they are needed. These  include: Canes. Walkers. Scooters. Crutches. Turn on the lights when you go into a dark area. Replace any light bulbs as soon as they burn out. Set up your furniture so you have a clear path. Avoid moving your furniture around. If any of your floors are uneven, fix them. If there are any pets around you, be aware of where they are. Review your medicines with your doctor. Some medicines can make you feel dizzy. This can increase your chance of falling. Ask your doctor what other things that you can do to help prevent falls. This information is not intended to replace advice given to you by your health care provider. Make sure you discuss any questions you have with your health care provider. Document Released: 05/24/2009 Document Revised: 01/03/2016 Document Reviewed: 09/01/2014 Elsevier Interactive Patient Education  2017 [employer].

## 2022-03-28 NOTE — Progress Notes (Signed)
Subjective:   Peter Kent is a 66 y.o. male who presents for an Initial Medicare Annual Wellness Visit.   I connected with Karl Pock  today by telephone and verified that I am speaking with the correct person using two identifiers. Location patient: home Location provider: work Persons participating in the virtual visit: patient, provider.   I discussed the limitations, risks, security and privacy concerns of performing an evaluation and management service by telephone and the availability of in person appointments. I also discussed with the patient that there may be a patient responsible charge related to this service. The patient expressed understanding and verbally consented to this telephonic visit.    Interactive audio and video telecommunications were attempted between this provider and patient, however failed, due to patient having technical difficulties OR patient did not have access to video capability.  We continued and completed visit with audio only.    Review of Systems     Cardiac Risk Factors include: advanced age (>7men, >65 women);male gender     Objective:    Today's Vitals   There is no height or weight on file to calculate BMI.     03/28/2022    9:53 AM 06/03/2021    3:46 AM 07/05/2019    6:48 AM 11/07/2014    9:00 AM 10/13/2013    3:02 PM  Advanced Directives  Does Patient Have a Medical Advance Directive? No No No No Patient does not have advance directive;Patient would not like information  Would patient like information on creating a medical advance directive? No - Patient declined No - Patient declined No - Patient declined      Current Medications (verified) Outpatient Encounter Medications as of 03/28/2022  Medication Sig   acetaminophen (TYLENOL) 650 MG CR tablet Take 650 mg by mouth every 8 (eight) hours as needed for pain.   amLODipine (NORVASC) 10 MG tablet Take 1 tablet (10 mg total) by mouth daily.   aspirin 81 MG tablet Take 81 mg  by mouth daily.   Cholecalciferol (THERA-D 2000) 50 MCG (2000 UT) TABS 1 tab by mouth once daily   meloxicam (MOBIC) 15 MG tablet Take 15 mg by mouth daily.   rosuvastatin (CRESTOR) 20 MG tablet Take 1 tablet (20 mg total) by mouth daily.   telmisartan (MICARDIS) 80 MG tablet Take 1 tablet (80 mg total) by mouth daily.   No facility-administered encounter medications on file as of 03/28/2022.    Allergies (verified) Patient has no known allergies.   History: Past Medical History:  Diagnosis Date   ERECTILE DYSFUNCTION 04/08/2007   Full dentures    GERD (gastroesophageal reflux disease)    HEAD TRAUMA, CLOSED 04/08/2007   HYPERCHOLESTEROLEMIA 04/08/2007   HYPERLIPIDEMIA 04/11/2007   HYPERTENSION 04/08/2007   Impaired glucose tolerance 07/05/2013   Morbid obesity (HCC) 04/08/2007   Preventative health care 03/22/2011   Wears glasses    Past Surgical History:  Procedure Laterality Date   CIRCUMCISION     INSERTION OF MESH N/A 10/19/2013   Procedure: INSERTION OF MESH;  Surgeon: Ernestene Mention, MD;  Location: [facility];  Service: General;  Laterality: N/A;   MULTIPLE TOOTH EXTRACTIONS     UMBILICAL HERNIA REPAIR N/A 10/19/2013   Procedure: UMBILICAL HERNIA REPAIR WITH MESH;  Surgeon: Ernestene Mention, MD;  Location: [facility];  Service: General;  Laterality: N/A;   Family History  Problem Relation Age of Onset   Hypertension Other    Heart  disease Other    Diabetes Other    Cancer Other        breast cancer   Colon cancer Neg Hx    Social History   Socioeconomic History   Marital status: Married    Spouse name: Not on file   Number of children: 2   Years of education: Not on file   Highest education level: Not on file  Occupational History   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  Tobacco Use   Smoking status: Never   Smokeless tobacco: Never  Substance and Sexual Activity   Alcohol use: No   Drug use: No   Sexual activity: Not on  file  Other Topics Concern   Not on file  Social History Narrative   Not on file   Social Determinants of Health   Financial Resource Strain: Low Risk  (03/28/2022)   Overall Financial Resource Strain (CARDIA)    Difficulty of Paying Living Expenses: Not hard at all  Food Insecurity: No Food Insecurity (03/28/2022)   Hunger Vital Sign    Worried About Running Out of Food in the Last Year: Never true    Ran Out of Food in the Last Year: Never true  Transportation Needs: No Transportation Needs (03/28/2022)   PRAPARE - Administrator, Civil Service (Medical): No    Lack of Transportation (Non-Medical): No  Physical Activity: Sufficiently Active (03/28/2022)   Exercise Vital Sign    Days of Exercise per Week: 5 days    Minutes of Exercise per Session: 60 min  Stress: No Stress Concern Present (03/28/2022)   [employer] of Occupational Health - Occupational Stress Questionnaire    Feeling of Stress : Not at all  Social Connections: Moderately Integrated (03/28/2022)   Social Connection and Isolation Panel [NHANES]    Frequency of Communication with Friends and Family: Three times a week    Frequency of Social Gatherings with Friends and Family: Three times a week    Attends Religious Services: More than 4 times per year    Active Member of Clubs or Organizations: No    Attends Banker Meetings: Never    Marital Status: Married    Tobacco Counseling Counseling given: Not Answered   Clinical Intake:  Pre-visit preparation completed: Yes  Pain : No/denies pain     Nutritional Risks: None Diabetes: No  How often do you need to have someone help you when you read instructions, pamphlets, or other written materials from your doctor or pharmacy?: 1 - Never What is the last grade level you completed in school?: college  Diabetic?no   Interpreter Needed?: No  Information entered by :: L.Wilson,LPN   Activities of Daily Living    03/28/2022     9:56 AM  In your present state of health, do you have any difficulty performing the following activities:  Hearing? 0  Vision? 0  Difficulty concentrating or making decisions? 0  Walking or climbing stairs? 0  Dressing or bathing? 0  Doing errands, shopping? 0  Preparing Food and eating ? N  Using the Toilet? N  In the past six months, have you accidently leaked urine? N  Do you have problems with loss of bowel control? N  Managing your Medications? N  Managing your Finances? N  Housekeeping or managing your Housekeeping? N    Patient Care Team: Corwin Levins, MD as PCP - General  Indicate any recent Medical Services you may have received from other  than [facility] providers in the past year (date may be approximate).     Assessment:   This is a routine wellness examination for [place].  Hearing/Vision screen Vision Screening - Comments:: Annual eye exams wear glasses   Dietary issues and exercise activities discussed: Current Exercise Habits: Home exercise routine, Type of exercise: strength training/weights;walking, Time (Minutes): 60, Frequency (Times/Week): 5, Weekly Exercise (Minutes/Week): 300, Intensity: Mild, Exercise limited by: None identified   Goals Addressed   None    Depression Screen    03/28/2022    9:54 AM 03/28/2022    9:52 AM 01/27/2022    2:07 PM 01/14/2021    8:22 AM 01/14/2021    8:08 AM 03/27/2020   10:08 AM 10/12/2018    3:58 PM  PHQ 2/9 Scores  PHQ - 2 Score 0 0 2 1 1  0 0  PHQ- 9 Score   5  1      Fall Risk    03/28/2022    9:54 AM 01/27/2022    2:06 PM 01/14/2021    8:17 AM 01/14/2021    8:08 AM 03/27/2020   10:08 AM  Fall Risk   Falls in the past year? 0 0 0 0 0  Number falls in past yr: 0 0 0 0   Injury with Fall? 0 0 0 0   Follow up Falls evaluation completed;Education provided        FALL RISK PREVENTION PERTAINING TO THE HOME:  Any stairs in or around the home? No  If so, are there any without handrails? No  Home free of loose throw rugs in  walkways, pet beds, electrical cords, etc? Yes  Adequate lighting in your home to reduce risk of falls? Yes   ASSISTIVE DEVICES UTILIZED TO PREVENT FALLS:  Life alert? No  Use of a cane, walker or w/c? No  Grab bars in the bathroom? No  Shower chair or bench in shower? No  Elevated toilet seat or a handicapped toilet? No     Cognitive Function:    Normal cognitive status assessed by telephone conversation  by this Nurse Health Advisor. No abnormalities found.      03/28/2022    9:57 AM  6CIT Screen  What Year? 0 points  What month? 0 points  What time? 0 points  Count back from 20 0 points  Months in reverse 0 points  Repeat phrase 0 points  Total Score 0 points    Immunizations Immunization History  Administered Date(s) Administered   Influenza,inj,Quad PF,6+ Mos 08/16/2014, 09/30/2017, 07/05/2019   PFIZER(Purple Top)SARS-COV-2 Vaccination 10/26/2019, 11/16/2019   PNEUMOCOCCAL CONJUGATE-20 01/27/2022   Td 03/26/2010   Tdap 03/27/2020    TDAP status: Up to date  Flu Vaccine status: Up to date  Pneumococcal vaccine status: Up to date  Covid-19 vaccine status: Completed vaccines  Qualifies for Shingles Vaccine? Yes   Zostavax completed No   Shingrix Completed?: No.    Education has been provided regarding the importance of this vaccine. Patient has been advised to call insurance company to determine out of pocket expense if they have not yet received this vaccine. Advised may also receive vaccine at local pharmacy or Health Dept. Verbalized acceptance and understanding.  Screening Tests Health Maintenance  Topic Date Due   Zoster Vaccines- Shingrix (1 of 2) Never done   INFLUENZA VACCINE  03/11/2022   COLONOSCOPY (Pts 45-5yrs Insurance coverage will need to be confirmed)  11/20/2024   TETANUS/TDAP  03/27/2030   Pneumonia Vaccine 65+ Years  old  Completed   Hepatitis C Screening  Completed   HPV VACCINES  Aged Out   COVID-19 Vaccine  Discontinued    Health  Maintenance  Health Maintenance Due  Topic Date Due   Zoster Vaccines- Shingrix (1 of 2) Never done   INFLUENZA VACCINE  03/11/2022    Colorectal cancer screening: Type of screening: Colonoscopy. Completed 11/21/2014. Repeat every 10 years  Lung Cancer Screening: (Low Dose CT Chest recommended if Age 97-80 years, 30 pack-year currently smoking OR have quit w/in 15years.) does not qualify.   Lung Cancer Screening Referral: n/a  Additional Screening:  Hepatitis C Screening: does not qualify;   Vision Screening: Recommended annual ophthalmology exams for early detection of glaucoma and other disorders of the eye. Is the patient up to date with their annual eye exam?  Yes  Who is the provider or what is the name of the office in which the patient attends annual eye exams? [facility]  If pt is not established with a provider, would they like to be referred to a provider to establish care? No .   Dental Screening: Recommended annual dental exams for proper oral hygiene  Community Resource Referral / Chronic Care Management: CRR required this visit?  No   CCM required this visit?  No      Plan:     I have personally reviewed and noted the following in the patient's chart:   Medical and social history Use of alcohol, tobacco or illicit drugs  Current medications and supplements including opioid prescriptions. Patient is not currently taking opioid prescriptions. Functional ability and status Nutritional status Physical activity Advanced directives List of other physicians Hospitalizations, surgeries, and ER visits in previous 12 months Vitals Screenings to include cognitive, depression, and falls Referrals and appointments  In addition, I have reviewed and discussed with patient certain preventive protocols, quality metrics, and best practice recommendations. A written personalized care plan for preventive services as well as general preventive health recommendations were  provided to patient.     Lorrene Reid, LPN   [id number]   Nurse Notes: none

## 2022-07-29 ENCOUNTER — Ambulatory Visit: Payer: Medicare Other | Admitting: Internal Medicine

## 2022-10-17 ENCOUNTER — Ambulatory Visit
Admission: RE | Admit: 2022-10-17 | Discharge: 2022-10-17 | Disposition: A | Discharge status: Home / Self Care | Payer: Medicare Other | Source: Ambulatory Visit | Attending: Internal Medicine | Admitting: Internal Medicine

## 2022-10-17 VITALS — BP 179/109 | HR 92 | Temp 98.1°F | Resp 12

## 2022-10-17 DIAGNOSIS — Z1152 Encounter for screening for COVID-19: Secondary | ICD-10-CM | POA: Diagnosis not present

## 2022-10-17 DIAGNOSIS — R059 Cough, unspecified: Secondary | ICD-10-CM | POA: Diagnosis present

## 2022-10-17 DIAGNOSIS — H1089 Other conjunctivitis: Secondary | ICD-10-CM | POA: Diagnosis not present

## 2022-10-17 DIAGNOSIS — B9689 Other specified bacterial agents as the cause of diseases classified elsewhere: Secondary | ICD-10-CM | POA: Insufficient documentation

## 2022-10-17 DIAGNOSIS — J069 Acute upper respiratory infection, unspecified: Secondary | ICD-10-CM | POA: Diagnosis not present

## 2022-10-17 DIAGNOSIS — R030 Elevated blood-pressure reading, without diagnosis of hypertension: Secondary | ICD-10-CM | POA: Diagnosis not present

## 2022-10-17 DIAGNOSIS — H109 Unspecified conjunctivitis: Secondary | ICD-10-CM | POA: Diagnosis present

## 2022-10-17 LAB — SARS CORONAVIRUS 2 (TAT 6-24 HRS): SARS Coronavirus 2: NEGATIVE

## 2022-10-17 MED ORDER — ERYTHROMYCIN 5 MG/GM OP OINT: TOPICAL_OINTMENT | OPHTHALMIC | 0 refills | Status: DC

## 2022-10-17 NOTE — ED Provider Notes (Signed)
[facility]    CSN: [id number] Arrival date & time: 10/17/22  1005      History   Chief Complaint Chief Complaint  Patient presents with   Eye Problem    HPI Peter Kent is a 67 y.o. male.   Patient presents with eye irritation and drainage, runny nose, cough that all started about 3 to 4 days ago.  Patient reports that his wife has had similar upper respiratory symptoms and cough.  He has been taking Coricidin HBP with some improvement in symptoms.  Patient reports that he has had some eye irritation, crustiness, purulent drainage.  He denies any blurry vision.  He does not wear contacts but does wear glasses.  Denies trauma or foreign body to the eyes.  Denies chest pain, shortness of breath, gastrointestinal symptoms.  Patient also has elevated blood pressure reading.  He reports he took his blood pressure medication approximately 20 minutes prior to arrival to urgent care.  He reports that he rarely monitors his blood pressure at home but states that it is consistently high.  He has notified his PCP and reports that PCP is "making a change" but he is not sure what this is.  Patient not reporting headache or blurry vision.   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   Hypertensive urgency 07/05/2019   Microhematuria 02/28/2015   Lower back pain [id number]   Umbilical hernia [id number]   Impaired glucose tolerance 07/05/2013   Dyspepsia 05/17/2013   Encounter  for well adult exam with abnormal findings 03/22/2011   Hyperlipidemia 04/11/2007   Morbid obesity ([place]) 04/08/2007   ERECTILE DYSFUNCTION 04/08/2007   Hypertension, uncontrolled 04/08/2007   HEAD TRAUMA, CLOSED 04/08/2007    Past Surgical History:  Procedure Laterality Date   CIRCUMCISION     INSERTION OF MESH N/A 10/19/2013   Procedure: INSERTION OF MESH;  Surgeon: Adin Hector, MD;  Location: [facility];  Service: General;  Laterality: N/A;   MULTIPLE TOOTH EXTRACTIONS     UMBILICAL HERNIA REPAIR N/A 10/19/2013   Procedure: UMBILICAL HERNIA REPAIR WITH MESH;  Surgeon: Adin Hector, MD;  Location: [facility];  Service: General;  Laterality: N/A;       Home Medications    Prior to Admission medications   Medication Sig Start Date End Date Taking? Authorizing Provider  acetaminophen (TYLENOL) 650 MG CR tablet Take 650 mg by mouth every 8 (eight) hours as needed for pain.   Yes [provider]  amLODipine (NORVASC) 10 MG tablet Take 1 tablet (10 mg total) by mouth daily. 01/14/21  Yes Biagio Borg, MD  aspirin 81 MG tablet Take 81 mg by mouth daily.   Yes [provider]  Cholecalciferol (THERA-D 2000) 50 MCG (2000 UT) TABS 1 tab by mouth once daily 01/14/21  Yes Biagio Borg, MD  erythromycin ophthalmic ointment Place a 1/2 inch ribbon of ointment into the lower eyelid of both eyes  4 times daily for 7 days. 10/17/22  Yes Jameis Newsham, Hildred Alamin E, FNP  meloxicam (MOBIC) 15 MG tablet Take 15 mg by mouth daily. 03/18/22  Yes [provider]  telmisartan (MICARDIS) 80 MG tablet Take 1 tablet (80 mg total) by mouth daily. 01/14/21  Yes Biagio Borg, MD  rosuvastatin (CRESTOR) 20 MG tablet Take 1 tablet (20 mg total) by mouth daily. 01/14/21   Biagio Borg, MD    Family History Family History  Problem Relation Age of Onset   Hypertension Other    Heart disease Other    Diabetes Other    Cancer Other        breast cancer   Colon cancer  Neg Hx     Social History Social History   Tobacco Use   Smoking status: Never   Smokeless tobacco: Never  Substance Use Topics   Alcohol use: No   Drug use: No     Allergies   Patient has no known allergies.   Review of Systems Review of Systems Per HPI  Physical Exam Triage Vital Signs ED Triage Vitals  Enc Vitals Group     BP 10/17/22 1032 (!) 188/108     Pulse Rate 10/17/22 1032 92     Resp 10/17/22 1032 12     Temp 10/17/22 1032 98.1 F (36.7 C)     Temp Source 10/17/22 1032 Oral     SpO2 10/17/22 1032 96 %     Weight --      Height --      Head Circumference --      Peak Flow --      Pain Score 10/17/22 1034 0     Pain Loc --      Pain Edu? --      Excl. in [place]? --    No data found.  Updated Vital Signs BP (!) 179/109 (BP Location: Left Arm)   Pulse 92   Temp 98.1 F (36.7 C) (Oral)   Resp 12   SpO2 96%   Visual Acuity Right Eye Distance: 20/25 Left Eye Distance: 20/25 Bilateral Distance: 20/25  Right Eye Near:   Left Eye Near:    Bilateral Near:     Physical Exam Constitutional:      General: He is not in acute distress.    Appearance: Normal appearance. He is not toxic-appearing or diaphoretic.  HENT:     Head: Normocephalic and atraumatic.     Right Ear: Tympanic membrane and ear canal normal.     Left Ear: Tympanic membrane and ear canal normal.     Nose: Congestion present.     Mouth/Throat:     Mouth: Mucous membranes are moist.     Pharynx: No posterior oropharyngeal erythema.  Eyes:     General: Lids are normal. Lids are everted, no foreign bodies appreciated. Vision grossly intact. Gaze aligned appropriately.     Extraocular Movements: Extraocular movements intact.     Conjunctiva/sclera:     Right eye: Right conjunctiva is injected. No chemosis, exudate or hemorrhage.    Left eye: Left conjunctiva is injected. No chemosis, exudate or hemorrhage.    Pupils: Pupils are equal, round, and reactive to light.  Cardiovascular:      Rate and Rhythm: Normal rate and regular rhythm.     Pulses: Normal pulses.     Heart sounds: Normal heart sounds.  Pulmonary:     Effort: Pulmonary effort is normal. No respiratory distress.  Breath sounds: Normal breath sounds. No stridor. No wheezing, rhonchi or rales.  Abdominal:     General: Abdomen is flat. Bowel sounds are normal.     Palpations: Abdomen is soft.  Musculoskeletal:        General: Normal range of motion.     Cervical back: Normal range of motion.  Skin:    General: Skin is warm and dry.  Neurological:     General: No focal deficit present.     Mental Status: He is alert and oriented to person, place, and time. Mental status is at baseline.     Cranial Nerves: Cranial nerves 2-12 are intact.     Sensory: Sensation is intact.     Motor: Motor function is intact.     Coordination: Coordination is intact.     Gait: Gait is intact.  Psychiatric:        Mood and Affect: Mood normal.        Behavior: Behavior normal.      UC Treatments / Results  Labs (all labs ordered are listed, but only abnormal results are displayed) Labs Reviewed  SARS CORONAVIRUS 2 (TAT 6-24 HRS)    EKG   Radiology No results found.  Procedures Procedures (including critical care time)  Medications Ordered in UC Medications - No data to display  Initial Impression / Assessment and Plan / UC Course  I have reviewed the triage vital signs and the nursing notes.  Pertinent labs & imaging results that were available during my care of the patient were reviewed by me and considered in my medical decision making (see chart for details).     Patient presents with symptoms likely from a viral upper respiratory infection. Do not suspect underlying cardiopulmonary process. Symptoms seem unlikely related to ACS, CHF or COPD exacerbations, pneumonia, pneumothorax. Patient is nontoxic appearing and not in need of emergent medical intervention. Covid test pending. Patient declined  bloodwork and covid antivirals if covid test is positive.   Recommended symptom control with over the counter medications that are safe with hypertension.  Suspicious of bacterial conjunctivitis so will treat with erythromycin ointment.  Patient has elevated blood pressure reading.  Encouraged patient to monitor blood pressure very closely over the next few days.  Encouraged patient to call PCP today to notify them of elevated blood pressure reading given he reports they are "making a change".  Encouraged follow-up with urgent care if blood pressure remains elevated and not able to get in with PCP over the next few days.  Patient is asymptomatic regarding blood pressure and neuroexam is normal so do not think that emergent evaluation is necessary.  Return if symptoms fail to improve in 1-2 weeks or you develop shortness of breath, chest pain, severe headache. Patient states understanding and is agreeable.  Discharged with PCP followup.  Final Clinical Impressions(s) / UC Diagnoses   Final diagnoses:  Bacterial conjunctivitis of both eyes  Viral upper respiratory tract infection with cough  Elevated blood pressure reading     Discharge Instructions      You have a viral illness that should run its course.  COVID test is pending.  Will call if it is positive.  I have prescribed an antibiotic eye medication for your eye symptoms as well.  Monitor your blood pressure very closely at home over the next few days.  Follow-up with primary care doctor today to notify them.  Follow-up with urgent care if unable to get in with primary care about  blood pressure.    ED Prescriptions     Medication Sig Dispense Auth. Provider   erythromycin ophthalmic ointment Place a 1/2 inch ribbon of ointment into the lower eyelid of both eyes 4 times daily for 7 days. 3.5 g Teodora Medici, [place]      PDMP not reviewed this encounter.   Teodora Medici, [place] 10/17/22 1116

## 2022-10-17 NOTE — Discharge Instructions (Addendum)
You have a viral illness that should run its course.  COVID test is pending.  Will call if it is positive.  I have prescribed an antibiotic eye medication for your eye symptoms as well.  Monitor your blood pressure very closely at home over the next few days.  Follow-up with primary care doctor today to notify them.  Follow-up with urgent care if unable to get in with primary care about blood pressure.

## 2022-10-17 NOTE — ED Triage Notes (Signed)
Pt is here for eyes red with discharge x3days

## 2023-01-09 ENCOUNTER — Telehealth: Payer: Self-pay

## 2023-01-09 MED ORDER — SCOPOLAMINE 1 MG/3DAYS TD PT72: 1.0000 | MEDICATED_PATCH | TRANSDERMAL | 0 refills | Status: AC

## 2023-01-09 NOTE — Addendum Note (Signed)
Addended by: Corwin Levins on: 01/09/2023 04:54 PM   Modules accepted: Orders

## 2023-01-09 NOTE — Telephone Encounter (Signed)
Pt is asking if PCP can send in a rx for Motion patches for traveling as it helps motion sickness as she is about to go on a trip and is needing it for her trip.  Please send to the [facility] on file.

## 2023-07-24 ENCOUNTER — Ambulatory Visit (INDEPENDENT_AMBULATORY_CARE_PROVIDER_SITE_OTHER): Payer: Medicare Other

## 2023-07-24 DIAGNOSIS — Z0000 Encounter for general adult medical examination without abnormal findings: Secondary | ICD-10-CM | POA: Diagnosis not present

## 2023-07-24 NOTE — Patient Instructions (Signed)
Peter Kent , Thank you for taking time to come for your Medicare Wellness Visit. I appreciate your ongoing commitment to your health goals. Please review the following plan we discussed and let me know if I can assist you in the future.   Referrals/Orders/Follow-Ups/Clinician Recommendations: none  This is a list of the screening recommended for you and due dates:  Health Maintenance  Topic Date Due   Zoster (Shingles) Vaccine (1 of 2) Never done   Flu Shot  03/12/2023   Medicare Annual Wellness Visit  07/23/2024   Colon Cancer Screening  11/20/2024   DTaP/Tdap/Td vaccine (3 - Td or Tdap) 03/27/2030   Pneumonia Vaccine  Completed   Hepatitis C Screening  Completed   HPV Vaccine  Aged Out   COVID-19 Vaccine  Discontinued    Advanced directives: (ACP Link)Information on Advanced Care Planning can be found at [facility] Advance Health Care Directives Advance Health Care Directives ([email or web address]   Next Medicare Annual Wellness Visit scheduled for next year: Yes  insert Preventive Care attachment Insert FALL PREVENTION attachment if needed

## 2023-07-24 NOTE — Progress Notes (Signed)
Subjective:   Peter Kent is a 67 y.o. male who presents for Medicare Annual/Subsequent preventive examination.  Visit Complete: Virtual I connected with  [name] on 07/24/23 by a audio enabled telemedicine application and verified that I am speaking with the correct person using two identifiers.  Patient Location: Home  Provider Location: Office/Clinic  I discussed the limitations of evaluation and management by telemedicine. The patient expressed understanding and agreed to proceed.  Vital Signs: Because this visit was a virtual/telehealth visit, some criteria may be missing or patient reported. Any vitals not documented were not able to be obtained and vitals that have been documented are patient reported.    Cardiac Risk Factors include: advanced age (>86men, >23 women);dyslipidemia;hypertension;male gender     Objective:    Today's Vitals   07/24/23 1126  PainSc: 4    There is no height or weight on file to calculate BMI.     07/24/2023   11:34 AM 03/28/2022    9:53 AM 06/03/2021    3:46 AM 07/05/2019    6:48 AM 11/07/2014    9:00 AM 10/13/2013    3:02 PM  Advanced Directives  Does Patient Have a Medical Advance Directive? No No No No No Patient does not have advance directive;Patient would not like information  Would patient like information on creating a medical advance directive?  No - Patient declined No - Patient declined No - Patient declined      Current Medications (verified) Outpatient Encounter Medications as of 07/24/2023  Medication Sig   acetaminophen (TYLENOL) 650 MG CR tablet Take 650 mg by mouth every 8 (eight) hours as needed for pain.   amLODipine (NORVASC) 10 MG tablet Take 1 tablet (10 mg total) by mouth daily.   aspirin 81 MG tablet Take 81 mg by mouth daily.   Cholecalciferol (THERA-D 2000) 50 MCG (2000 UT) TABS 1 tab by mouth once daily   meloxicam (MOBIC) 15 MG tablet Take 15 mg by mouth daily.   erythromycin ophthalmic  ointment Place a 1/2 inch ribbon of ointment into the lower eyelid of both eyes 4 times daily for 7 days. (Patient not taking: Reported on 07/24/2023)   rosuvastatin (CRESTOR) 20 MG tablet Take 1 tablet (20 mg total) by mouth daily. (Patient not taking: Reported on 07/24/2023)   scopolamine (TRANSDERM-SCOP) 1 MG/3DAYS Place 1 patch (1.5 mg total) onto the skin every 3 (three) days. (Patient not taking: Reported on 07/24/2023)   telmisartan (MICARDIS) 80 MG tablet Take 1 tablet (80 mg total) by mouth daily. (Patient not taking: Reported on 07/24/2023)   No facility-administered encounter medications on file as of 07/24/2023.    Allergies (verified) Patient has no known allergies.   History: Past Medical History:  Diagnosis Date   ERECTILE DYSFUNCTION 04/08/2007   Full dentures    GERD (gastroesophageal reflux disease)    HEAD TRAUMA, CLOSED 04/08/2007   HYPERCHOLESTEROLEMIA 04/08/2007   HYPERLIPIDEMIA 04/11/2007   HYPERTENSION 04/08/2007   Impaired glucose tolerance 07/05/2013   Morbid obesity (HCC) 04/08/2007   Preventative health care 03/22/2011   Wears glasses    Past Surgical History:  Procedure Laterality Date   CIRCUMCISION     INSERTION OF MESH N/A 10/19/2013   Procedure: INSERTION OF MESH;  Surgeon: Ernestene Mention, MD;  Location: [facility];  Service: General;  Laterality: N/A;   MULTIPLE TOOTH EXTRACTIONS     UMBILICAL HERNIA REPAIR N/A 10/19/2013   Procedure: UMBILICAL HERNIA REPAIR WITH MESH;  Surgeon: Ernestene Mention, MD;  Location: [facility];  Service: General;  Laterality: N/A;   Family History  Problem Relation Age of Onset   Hypertension Other    Heart disease Other    Diabetes Other    Cancer Other        breast cancer   Colon cancer Neg Hx    Social History   Socioeconomic History   Marital status: Married    Spouse name: Not on file   Number of children: 2   Years of education: Not on file   Highest education level: Not on  file  Occupational History   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  Tobacco Use   Smoking status: Never   Smokeless tobacco: Never  Vaping Use   Vaping status: Never Used  Substance and Sexual Activity   Alcohol use: No   Drug use: No   Sexual activity: Not on file  Other Topics Concern   Not on file  Social History Narrative   Not on file   Social Drivers of Health   Financial Resource Strain: Low Risk  (07/24/2023)   Overall Financial Resource Strain (CARDIA)    Difficulty of Paying Living Expenses: Not hard at all  Food Insecurity: No Food Insecurity (07/24/2023)   Hunger Vital Sign    Worried About Running Out of Food in the Last Year: Never true    Ran Out of Food in the Last Year: Never true  Transportation Needs: No Transportation Needs (07/24/2023)   PRAPARE - Administrator, Civil Service (Medical): No    Lack of Transportation (Non-Medical): No  Physical Activity: Sufficiently Active (07/24/2023)   Exercise Vital Sign    Days of Exercise per Week: 5 days    Minutes of Exercise per Session: 60 min  Stress: No Stress Concern Present (07/24/2023)   [employer] of Occupational Health - Occupational Stress Questionnaire    Feeling of Stress : Not at all  Social Connections: Moderately Integrated (07/24/2023)   Social Connection and Isolation Panel [NHANES]    Frequency of Communication with Friends and Family: More than three times a week    Frequency of Social Gatherings with Friends and Family: Twice a week    Attends Religious Services: More than 4 times per year    Active Member of [employer] or Organizations: No    Attends Engineer, structural: Never    Marital Status: Married    Tobacco Counseling Counseling given: Not Answered   Clinical Intake:  Pre-visit preparation completed: Yes  Pain : 0-10 Pain Score: 4  Pain Type: Chronic pain Pain Location: Knee Pain Orientation: Right, Left Pain Descriptors / Indicators:  Aching Pain Onset: More than a month ago Pain Frequency: Constant     Nutritional Risks: None Diabetes: No  How often do you need to have someone help you when you read instructions, pamphlets, or other written materials from your doctor or pharmacy?: 1 - Never  Interpreter Needed?: No  Information entered by :: NAllen LPN   Activities of Daily Living    07/24/2023   11:28 AM  In your present state of health, do you have any difficulty performing the following activities:  Hearing? 0  Vision? 0  Difficulty concentrating or making decisions? 0  Walking or climbing stairs? 0  Dressing or bathing? 0  Doing errands, shopping? 0  Preparing Food and eating ? N  Using the Toilet? N  In the past six months,  have you accidently leaked urine? N  Do you have problems with loss of bowel control? N  Managing your Medications? N  Managing your Finances? N  Housekeeping or managing your Housekeeping? N    Patient Care Team: Corwin Levins, MD as PCP - General  Indicate any recent Medical Services you may have received from other than [facility] providers in the past year (date may be approximate).     Assessment:   This is a routine wellness examination for Peter Kent.  Hearing/Vision screen Hearing Screening - Comments:: Denies hearing issues Vision Screening - Comments:: Regular eye exams, [facility]   Goals Addressed             This Visit's Progress    Patient Stated       07/24/2023, wants to get back yard cleaned up       Depression Screen    07/24/2023   11:37 AM 03/28/2022    9:54 AM 03/28/2022    9:52 AM 01/27/2022    2:07 PM 01/14/2021    8:22 AM 01/14/2021    8:08 AM 03/27/2020   10:08 AM  PHQ 2/9 Scores  PHQ - 2 Score 0 0 0 2 1 1  0  PHQ- 9 Score    5  1     Fall Risk    07/24/2023   11:35 AM 03/28/2022    9:54 AM 01/27/2022    2:06 PM 01/14/2021    8:17 AM 01/14/2021    8:08 AM  Fall Risk   Falls in the past year? 0 0 0 0 0  Number falls in past yr: 0 0 0 0  0  Injury with Fall? 0 0 0 0 0  Risk for fall due to : Medication side effect      Follow up Falls prevention discussed;Falls evaluation completed Falls evaluation completed;Education provided       MEDICARE RISK AT HOME: Medicare Risk at Home Any stairs in or around the home?: No If so, are there any without handrails?: No Home free of loose throw rugs in walkways, pet beds, electrical cords, etc?: Yes Adequate lighting in your home to reduce risk of falls?: Yes Life alert?: No Use of a cane, walker or w/c?: No Grab bars in the bathroom?: Yes Shower chair or bench in shower?: No Elevated toilet seat or a handicapped toilet?: Yes  TIMED UP AND GO:  Was the test performed?  No    Cognitive Function:        07/24/2023   11:37 AM 03/28/2022    9:57 AM  6CIT Screen  What Year? 0 points 0 points  What month? 0 points 0 points  What time? 0 points 0 points  Count back from 20 0 points 0 points  Months in reverse 0 points 0 points  Repeat phrase 0 points 0 points  Total Score 0 points 0 points    Immunizations Immunization History  Administered Date(s) Administered   Influenza,inj,Quad PF,6+ Mos 08/16/2014, 09/30/2017, 07/05/2019   PFIZER(Purple Top)SARS-COV-2 Vaccination 10/26/2019, 11/16/2019   PNEUMOCOCCAL CONJUGATE-20 01/27/2022   Td 03/26/2010   Tdap 03/27/2020    TDAP status: Up to date  Flu Vaccine status: Due, Education has been provided regarding the importance of this vaccine. Advised may receive this vaccine at local pharmacy or Health Dept. Aware to provide a copy of the vaccination record if obtained from local pharmacy or Health Dept. Verbalized acceptance and understanding.  Pneumococcal vaccine status: Up to date  Covid-19  vaccine status: Information provided on how to obtain vaccines.   Qualifies for Shingles Vaccine? Yes   Zostavax completed No   Shingrix Completed?: No.    Education has been provided regarding the importance of this vaccine.  Patient has been advised to call insurance company to determine out of pocket expense if they have not yet received this vaccine. Advised may also receive vaccine at local pharmacy or Health Dept. Verbalized acceptance and understanding.  Screening Tests Health Maintenance  Topic Date Due   Zoster Vaccines- Shingrix (1 of 2) Never done   INFLUENZA VACCINE  03/12/2023   Medicare Annual Wellness (AWV)  07/23/2024   Colonoscopy  11/20/2024   DTaP/Tdap/Td (3 - Td or Tdap) 03/27/2030   Pneumonia Vaccine 66+ Years old  Completed   Hepatitis C Screening  Completed   HPV VACCINES  Aged Out   COVID-19 Vaccine  Discontinued    Health Maintenance  Health Maintenance Due  Topic Date Due   Zoster Vaccines- Shingrix (1 of 2) Never done   INFLUENZA VACCINE  03/12/2023    Colorectal cancer screening: Type of screening: Colonoscopy. Completed 11/21/2014. Repeat every 10 years  Lung Cancer Screening: (Low Dose CT Chest recommended if Age 27-80 years, 20 pack-year currently smoking OR have quit w/in 15years.) does not qualify.   Lung Cancer Screening Referral: no  Additional Screening:  Hepatitis C Screening: does qualify; Completed 09/30/2017  Vision Screening: Recommended annual ophthalmology exams for early detection of glaucoma and other disorders of the eye. Is the patient up to date with their annual eye exam?  Yes  Who is the provider or what is the name of the office in which the patient attends annual eye exams? [facility] If pt is not established with a provider, would they like to be referred to a provider to establish care? No .   Dental Screening: Recommended annual dental exams for proper oral hygiene  Diabetic Foot Exam: n/a  Community Resource Referral / Chronic Care Management: CRR required this visit?  No   CCM required this visit?  No     Plan:     I have personally reviewed and noted the following in the patient's chart:   Medical and social history Use of  alcohol, tobacco or illicit drugs  Current medications and supplements including opioid prescriptions. Patient is not currently taking opioid prescriptions. Functional ability and status Nutritional status Physical activity Advanced directives List of other physicians Hospitalizations, surgeries, and ER visits in previous 12 months Vitals Screenings to include cognitive, depression, and falls Referrals and appointments  In addition, I have reviewed and discussed with patient certain preventive protocols, quality metrics, and best practice recommendations. A written personalized care plan for preventive services as well as general preventive health recommendations were provided to patient.     Barb Merino, LPN   [id number]   After Visit Summary: (MyChart) Due to this being a telephonic visit, the after visit summary with patients personalized plan was offered to patient via MyChart   Nurse Notes: none

## 2023-11-20 ENCOUNTER — Ambulatory Visit: Admitting: Internal Medicine

## 2023-11-20 ENCOUNTER — Encounter: Payer: Self-pay | Admitting: Internal Medicine

## 2023-11-20 VITALS — BP 160/94 | HR 105 | Temp 97.6°F | Ht 75.0 in | Wt 258.0 lb

## 2023-11-20 DIAGNOSIS — Z01818 Encounter for other preprocedural examination: Secondary | ICD-10-CM

## 2023-11-20 DIAGNOSIS — R7302 Impaired glucose tolerance (oral): Secondary | ICD-10-CM

## 2023-11-20 DIAGNOSIS — I10 Essential (primary) hypertension: Secondary | ICD-10-CM

## 2023-11-20 DIAGNOSIS — Z0001 Encounter for general adult medical examination with abnormal findings: Secondary | ICD-10-CM | POA: Diagnosis not present

## 2023-11-20 DIAGNOSIS — E611 Iron deficiency: Secondary | ICD-10-CM

## 2023-11-20 DIAGNOSIS — E538 Deficiency of other specified B group vitamins: Secondary | ICD-10-CM | POA: Diagnosis not present

## 2023-11-20 DIAGNOSIS — E559 Vitamin D deficiency, unspecified: Secondary | ICD-10-CM

## 2023-11-20 DIAGNOSIS — E7800 Pure hypercholesterolemia, unspecified: Secondary | ICD-10-CM

## 2023-11-20 DIAGNOSIS — Z125 Encounter for screening for malignant neoplasm of prostate: Secondary | ICD-10-CM | POA: Diagnosis not present

## 2023-11-20 LAB — LIPID PANEL
Cholesterol: 282 mg/dL — ABNORMAL HIGH (ref 0–200)
HDL: 62.1 mg/dL (ref >39.00)
LDL Cholesterol: 208 mg/dL — ABNORMAL HIGH (ref 0–99)
NonHDL: 220.17
Total CHOL/HDL Ratio: 5
Triglycerides: 60 mg/dL (ref 0.0–149.0)
VLDL: 12 mg/dL (ref 0.0–40.0)

## 2023-11-20 LAB — IBC PANEL
Iron: 144 ug/dL (ref 42–165)
Saturation Ratios: 33.1 % (ref 20.0–50.0)
TIBC: 435.4 ug/dL (ref 250.0–450.0)
Transferrin: 311 mg/dL (ref 212.0–360.0)

## 2023-11-20 LAB — CBC WITH DIFFERENTIAL/PLATELET
Basophils Absolute: 0 10*3/uL (ref 0.0–0.1)
Basophils Relative: 0.1 % (ref 0.0–3.0)
Eosinophils Absolute: 0 10*3/uL (ref 0.0–0.7)
Eosinophils Relative: 0 % (ref 0.0–5.0)
HCT: 45.5 % (ref 39.0–52.0)
Hemoglobin: 15 g/dL (ref 13.0–17.0)
Lymphocytes Relative: 6.1 % — ABNORMAL LOW (ref 12.0–46.0)
Lymphs Abs: 1 10*3/uL (ref 0.7–4.0)
MCHC: 33.1 g/dL (ref 30.0–36.0)
MCV: 90.3 fl (ref 78.0–100.0)
Monocytes Absolute: 0.8 10*3/uL (ref 0.1–1.0)
Monocytes Relative: 5 % (ref 3.0–12.0)
Neutro Abs: 13.8 10*3/uL — ABNORMAL HIGH (ref 1.4–7.7)
Neutrophils Relative %: 88.8 % — ABNORMAL HIGH (ref 43.0–77.0)
Platelets: 293 10*3/uL (ref 150.0–400.0)
RBC: 5.04 Mil/uL (ref 4.22–5.81)
RDW: 14 % (ref 11.5–15.5)
WBC: 15.6 10*3/uL — ABNORMAL HIGH (ref 4.0–10.5)

## 2023-11-20 LAB — BASIC METABOLIC PANEL WITH GFR
BUN: 21 mg/dL (ref 6–23)
CO2: 25 meq/L (ref 19–32)
Calcium: 9.7 mg/dL (ref 8.4–10.5)
Chloride: 101 meq/L (ref 96–112)
Creatinine, Ser: 0.99 mg/dL (ref 0.40–1.50)
GFR: 78.74 mL/min (ref >60.00)
Glucose, Bld: 125 mg/dL — ABNORMAL HIGH (ref 70–99)
Potassium: 4.3 meq/L (ref 3.5–5.1)
Sodium: 136 meq/L (ref 135–145)

## 2023-11-20 LAB — HEPATIC FUNCTION PANEL
ALT: 13 U/L (ref 0–53)
AST: 18 U/L (ref 0–37)
Albumin: 4.9 g/dL (ref 3.5–5.2)
Alkaline Phosphatase: 91 U/L (ref 39–117)
Bilirubin, Direct: 0.1 mg/dL (ref 0.0–0.3)
Total Bilirubin: 0.7 mg/dL (ref 0.2–1.2)
Total Protein: 7.8 g/dL (ref 6.0–8.3)

## 2023-11-20 LAB — VITAMIN B12: Vitamin B-12: 406 pg/mL (ref 211–911)

## 2023-11-20 LAB — URINALYSIS, ROUTINE W REFLEX MICROSCOPIC
Bilirubin Urine: NEGATIVE
Ketones, ur: NEGATIVE
Leukocytes,Ua: NEGATIVE
Nitrite: NEGATIVE
Specific Gravity, Urine: 1.025 (ref 1.000–1.030)
Urine Glucose: NEGATIVE
Urobilinogen, UA: 0.2 (ref 0.0–1.0)
pH: 6 (ref 5.0–8.0)

## 2023-11-20 LAB — PSA: PSA: 1.48 ng/mL (ref 0.10–4.00)

## 2023-11-20 LAB — HEMOGLOBIN A1C: Hgb A1c MFr Bld: 6 % (ref 4.6–6.5)

## 2023-11-20 LAB — FERRITIN: Ferritin: 150.5 ng/mL (ref 22.0–322.0)

## 2023-11-20 LAB — VITAMIN D 25 HYDROXY (VIT D DEFICIENCY, FRACTURES): VITD: 29.76 ng/mL — ABNORMAL LOW (ref 30.00–100.00)

## 2023-11-20 LAB — TSH: TSH: 1.19 u[IU]/mL (ref 0.35–5.50)

## 2023-11-20 LAB — MICROALBUMIN / CREATININE URINE RATIO
Creatinine,U: 162.1 mg/dL
Microalb Creat Ratio: 67.5 mg/g — ABNORMAL HIGH (ref 0.0–30.0)
Microalb, Ur: 10.9 mg/dL — ABNORMAL HIGH (ref 0.0–1.9)

## 2023-11-20 MED ORDER — AMLODIPINE BESYLATE 10 MG PO TABS: 10.0000 mg | ORAL_TABLET | Freq: Every day | ORAL | 3 refills | Status: AC

## 2023-11-20 MED ORDER — TELMISARTAN 80 MG PO TABS: 80.0000 mg | ORAL_TABLET | Freq: Every day | ORAL | 3 refills | Status: AC

## 2023-11-20 MED ORDER — ROSUVASTATIN CALCIUM 20 MG PO TABS: 20.0000 mg | ORAL_TABLET | Freq: Every day | ORAL | 3 refills | Status: AC

## 2023-11-20 NOTE — Patient Instructions (Addendum)
 Please have your Shingrix (shingles) shots done at your local pharmacy.  Please continue all other medications as before, and refills have been done if requested.  Please have the pharmacy call with any other refills you may need.  Please continue your efforts at being more active, low cholesterol diet, and weight control.  You are otherwise up to date with prevention measures today.  Please keep your appointments with your specialists as you may have planned  Please go to the LAB at the blood drawing area for the tests to be done  You will be contacted by phone if any changes need to be made immediately.  Otherwise, you will receive a letter about your results with an explanation, but please check with MyChart first.  Please make an Appointment to return in 6 months, or sooner if needed

## 2023-11-20 NOTE — Progress Notes (Signed)
 Patient ID: Peter Kent, male   DOB: [birth year], 68 y.o.   MRN: [id number]         Chief Complaint:: wellness exam and Medical Management of Chronic Issues (Discuss lab work for hip replacement )  , preop, htn, hld, hyperglycemia       HPI:  Peter Kent is a 68 y.o. male here for wellness exam; for shingrix at pharmacy, o/w up to date                        Also Pt denies chest pain, increased sob or doe, wheezing, orthopnea, PND, increased LE swelling, palpitations, dizziness or syncope.   Pt denies polydipsia, polyuria, or new focal neuro s/s.    Pt denies fever, wt loss, night sweats, loss of appetite, or other constitutional symptoms  Has not been taking BP meds.  Seen per orthopedic yesterday with severe right knee pain, but even worse left hip now for planned left hip THR.    Wt Readings from Last 3 Encounters:  11/20/23 258 lb (117 kg)  01/27/22 264 lb (119.7 kg)  06/03/21 250 lb (113.4 kg)   BP Readings from Last 3 Encounters:  11/20/23 (!) 160/94  10/17/22 (!) 179/109  01/27/22 (!) 160/100   Immunization History  Administered Date(s) Administered   Influenza,inj,Quad PF,6+ Mos 08/16/2014, 09/30/2017, 07/05/2019   PFIZER(Purple Top)SARS-COV-2 Vaccination 10/26/2019, 11/16/2019   PNEUMOCOCCAL CONJUGATE-20 01/27/2022   Td 03/26/2010   Tdap 03/27/2020   Health Maintenance Due  Topic Date Due   Zoster Vaccines- Shingrix (1 of 2) Never done      Past Medical History:  Diagnosis Date   ERECTILE DYSFUNCTION 04/08/2007   Full dentures    GERD (gastroesophageal reflux disease)    HEAD TRAUMA, CLOSED 04/08/2007   HYPERCHOLESTEROLEMIA 04/08/2007   HYPERLIPIDEMIA 04/11/2007   HYPERTENSION 04/08/2007   Impaired glucose tolerance 07/05/2013   Morbid obesity (HCC) 04/08/2007   Preventative health care 03/22/2011   Wears glasses    Past Surgical History:  Procedure Laterality Date   CIRCUMCISION     INSERTION OF MESH N/A 10/19/2013   Procedure: INSERTION OF MESH;   Surgeon: Levert Ready, MD;  Location: [facility];  Service: General;  Laterality: N/A;   MULTIPLE TOOTH EXTRACTIONS     UMBILICAL HERNIA REPAIR N/A 10/19/2013   Procedure: UMBILICAL HERNIA REPAIR WITH MESH;  Surgeon: Levert Ready, MD;  Location: [facility];  Service: General;  Laterality: N/A;    reports that he has never smoked. He has never used smokeless tobacco. He reports that he does not drink alcohol and does not use drugs. family history includes Cancer in an other family member; Diabetes in an other family member; Heart disease in an other family member; Hypertension in an other family member. No Known Allergies Current Outpatient Medications on File Prior to Visit  Medication Sig Dispense Refill   acetaminophen (TYLENOL) 650 MG CR tablet Take 650 mg by mouth every 8 (eight) hours as needed for pain.     aspirin 81 MG tablet Take 81 mg by mouth daily.     Cholecalciferol (THERA-D 2000) 50 MCG (2000 UT) TABS 1 tab by mouth once daily 30 tablet 99   meloxicam (MOBIC) 15 MG tablet Take 15 mg by mouth daily.     scopolamine (TRANSDERM-SCOP) 1 MG/3DAYS Place 1 patch (1.5 mg total) onto the skin every 3 (three) days. 10 patch 0   No current facility-administered  medications on file prior to visit.        ROS:  All others reviewed and negative.  Objective        PE:  BP (!) 160/94 (BP Location: Right Arm, Patient Position: Sitting, Cuff Size: Normal)   Pulse (!) 105   Temp 97.6 F (36.4 C) (Oral)   Ht 6\' 3"  (1.905 m)   Wt 258 lb (117 kg)   SpO2 98%   BMI 32.25 kg/m                 Constitutional: Pt appears in NAD               HENT: Head: NCAT.                Right Ear: External ear normal.                 Left Ear: External ear normal.                Eyes: . Pupils are equal, round, and reactive to light. Conjunctivae and EOM are normal               Nose: without d/c or deformity               Neck: Neck supple. Gross normal ROM                Cardiovascular: Normal rate and regular rhythm.                 Pulmonary/Chest: Effort normal and breath sounds without rales or wheezing.                Abd:  Soft, NT, ND, + BS, no organomegaly               Neurological: Pt is alert. At baseline orientation, motor grossly intact               Skin: Skin is warm. No rashes, no other new lesions, LE edema - none               Psychiatric: Pt behavior is normal without agitation   Micro: none  Cardiac tracings I have personally interpreted today:  none  Pertinent Radiological findings (summarize): none   Lab Results  Component Value Date   WBC 15.6 (H) 11/20/2023   HGB 15.0 11/20/2023   HCT 45.5 11/20/2023   PLT 293.0 11/20/2023   GLUCOSE 125 (H) 11/20/2023   CHOL 282 (H) 11/20/2023   TRIG 60.0 11/20/2023   HDL 62.10 11/20/2023   LDLDIRECT 156.1 09/23/2007   LDLCALC 208 (H) 11/20/2023   ALT 13 11/20/2023   AST 18 11/20/2023   NA 136 11/20/2023   K 4.3 11/20/2023   CL 101 11/20/2023   CREATININE 0.99 11/20/2023   BUN 21 11/20/2023   CO2 25 11/20/2023   TSH 1.19 11/20/2023   PSA 1.48 11/20/2023   HGBA1C 6.0 11/20/2023   MICROALBUR 10.9 (H) 11/20/2023   Assessment/Plan:  Peter Kent is a 68 y.o. Black or African American [2] male with  has a past medical history of ERECTILE DYSFUNCTION (04/08/2007), Full dentures, GERD (gastroesophageal reflux disease), HEAD TRAUMA, CLOSED (04/08/2007), HYPERCHOLESTEROLEMIA (04/08/2007), HYPERLIPIDEMIA (04/11/2007), HYPERTENSION (04/08/2007), Impaired glucose tolerance (07/05/2013), Morbid obesity (HCC) (04/08/2007), Preventative health care (03/22/2011), and Wears glasses.  Encounter for well adult exam with abnormal findings Age and sex appropriate education and counseling updated with regular exercise and diet Referrals for preventative services -  none needed Immunizations addressed - for shingrix at pharmacy Smoking counseling  - none needed Evidence for depression or other mood  disorder - none significant Most recent labs reviewed. I have personally reviewed and have noted: 1) the patient's medical and social history 2) The patient's current medications and supplements 3) The patient's height, weight, and BMI have been recorded in the chart   Hyperlipidemia Lab Results  Component Value Date   LDLCALC 208 (H) 11/20/2023   Severe uncontrolled, for restart crestor 20 every day   Hypertension, uncontrolled BP Readings from Last 3 Encounters:  11/20/23 (!) 160/94  10/17/22 (!) 179/109  01/27/22 (!) 160/100   uncontrolled, pt to restart medical treatment norvasc 10 every day, micardis 80 qd   Impaired glucose tolerance Lab Results  Component Value Date   HGBA1C 6.0 11/20/2023   Stable, pt to continue current medical treatment - diet, wt control   Vitamin D deficiency Last vitamin D Lab Results  Component Value Date   VD25OH 29.76 (L) 11/20/2023   Low, to start oral replacement   Preop exam for internal medicine Pt ok for surgury pending BP improveement Followup: Return in about 6 months (around 05/21/2024).  Rosalia Colonel, MD 11/21/2023 9:56 PM [facility] Internal Medicine

## 2023-11-21 ENCOUNTER — Encounter: Payer: Self-pay | Admitting: Internal Medicine

## 2023-11-21 DIAGNOSIS — Z01818 Encounter for other preprocedural examination: Secondary | ICD-10-CM | POA: Insufficient documentation

## 2023-11-21 NOTE — Assessment & Plan Note (Signed)
 Lab Results  Component Value Date   HGBA1C 6.0 11/20/2023   Stable, pt to continue current medical treatment - diet, wt control

## 2023-11-21 NOTE — Assessment & Plan Note (Signed)
 Last vitamin D Lab Results  Component Value Date   VD25OH 29.76 (L) 11/20/2023   Low, to start oral replacement

## 2023-11-21 NOTE — Assessment & Plan Note (Signed)
 Pt ok for surgury pending BP improveement

## 2023-11-21 NOTE — Assessment & Plan Note (Signed)

## 2023-11-21 NOTE — Assessment & Plan Note (Signed)
 Lab Results  Component Value Date   LDLCALC 208 (H) 11/20/2023   Severe uncontrolled, for restart crestor 20 every day

## 2023-11-21 NOTE — Assessment & Plan Note (Signed)
 BP Readings from Last 3 Encounters:  11/20/23 (!) 160/94  10/17/22 (!) 179/109  01/27/22 (!) 160/100   uncontrolled, pt to restart medical treatment norvasc 10 every day, micardis 80 qd

## 2023-12-25 HISTORY — PX: HIP SURGERY: SHX245

## 2024-07-29 ENCOUNTER — Ambulatory Visit: Payer: Medicare Other

## 2024-07-29 VITALS — Ht 75.0 in | Wt 258.0 lb

## 2024-07-29 DIAGNOSIS — Z0000 Encounter for general adult medical examination without abnormal findings: Secondary | ICD-10-CM | POA: Diagnosis not present

## 2024-07-29 NOTE — Patient Instructions (Signed)
 Peter Kent,  Thank you for taking the time for your Medicare Wellness Visit. I appreciate your continued commitment to your health goals. Please review the care plan we discussed, and feel free to reach out if I can assist you further.  Please note that Annual Wellness Visits do not include a physical exam. Some assessments may be limited, especially if the visit was conducted virtually. If needed, we may recommend an in-person follow-up with your provider.  Ongoing Care Seeing your primary care provider every 3 to 6 months helps us  monitor your health and provide consistent, personalized care. Last office visit on 11/20/2023.  You are due for a flu vaccine and a Shingles vaccine and can get that done at your local pharmacy.  Keep up the good work.    Referrals If a referral was made during today's visit and you haven't received any updates within two weeks, please contact the referred provider directly to check on the status.  Recommended Screenings:  Health Maintenance  Topic Date Due   Zoster (Shingles) Vaccine (1 of 2) Never done   Flu Shot  03/11/2024   Medicare Annual Wellness Visit  07/23/2024   Colon Cancer Screening  11/20/2024   DTaP/Tdap/Td vaccine (3 - Td or Tdap) 03/27/2030   Pneumococcal Vaccine for age over 55  Completed   Hepatitis C Screening  Completed   Meningitis B Vaccine  Aged Out   COVID-19 Vaccine  Discontinued       07/29/2024   11:46 AM  Advanced Directives  Does Patient Have a Medical Advance Directive? No    Vision: Annual vision screenings are recommended for early detection of glaucoma, cataracts, and diabetic retinopathy. These exams can also reveal signs of chronic conditions such as diabetes and high blood pressure.  Dental: Annual dental screenings help detect early signs of oral cancer, gum disease, and other conditions linked to overall health, including heart disease and diabetes.  Please see the attached documents for additional preventive  care recommendations.

## 2024-07-29 NOTE — Progress Notes (Signed)
 "   Chief Complaint  Patient presents with   Medicare Wellness     Subjective:   Peter Kent is a 68 y.o. male who presents for a Medicare Annual Wellness Visit.  Visit info / Clinical Intake: Medicare Wellness Visit Type:: Subsequent Annual Wellness Visit Persons participating in visit and providing information:: patient Medicare Wellness Visit Mode:: Telephone If telephone:: video declined Since this visit was completed virtually, some vitals may be partially provided or unavailable. Missing vitals are due to the limitations of the virtual format.: Unable to obtain vitals - no equipment If Telephone or Video please confirm:: I connected with patient using audio/video enable telemedicine. I verified patient identity with two identifiers, discussed telehealth limitations, and patient agreed to proceed. Patient Location:: Out Provider Location:: Home Interpreter Needed?: No Pre-visit prep was completed: yes AWV questionnaire completed by patient prior to visit?: no Living arrangements:: lives with spouse/significant other Patient's Overall Health Status Rating: good Typical amount of pain: none Does pain affect daily life?: no Are you currently prescribed opioids?: no  Dietary Habits and Nutritional Risks How many meals a day?: 2 Eats fruit and vegetables daily?: yes Most meals are obtained by: preparing own meals In the last 2 weeks, have you had any of the following?: none Diabetic:: no  Functional Status Activities of Daily Living (to include ambulation/medication): Independent Ambulation: Independent with device- listed below Home Assistive Devices/Equipment: Eyeglasses Medication Administration: Independent Home Management (perform basic housework or laundry): Independent Manage your own finances?: yes Primary transportation is: driving Concerns about vision?: no *vision screening is required for WTM* Concerns about hearing?: no  Fall Screening Falls in the  past year?: 0 Number of falls in past year: 0 Was there an injury with Fall?: 0 Fall Risk Category Calculator: 0 Patient Fall Risk Level: Low Fall Risk  Fall Risk Patient at Risk for Falls Due to: No Fall Risks Fall risk Follow up: Falls evaluation completed; Falls prevention discussed  Home and Transportation Safety: All rugs have non-skid backing?: N/A, no rugs All stairs or steps have railings?: N/A, no stairs Grab bars in the bathtub or shower?: yes Have non-skid surface in bathtub or shower?: yes Good home lighting?: yes Regular seat belt use?: yes Hospital stays in the last year:: no  Cognitive Assessment Difficulty concentrating, remembering, or making decisions? : no Will 6CIT or Mini Cog be Completed: no 6CIT or Mini Cog Declined: patient alert, oriented, able to answer questions appropriately and recall recent events  Advance Directives (For Healthcare) Does Patient Have a Medical Advance Directive?: No  Reviewed/Updated  Reviewed/Updated: Reviewed All (Medical, Surgical, Family, Medications, Allergies, Care Teams, Patient Goals)    Allergies (verified) Patient has no known allergies.   Current Medications (verified) Outpatient Encounter Medications as of 07/29/2024  Medication Sig   acetaminophen  (TYLENOL ) 650 MG CR tablet Take 650 mg by mouth every 8 (eight) hours as needed for pain.   amLODipine  (NORVASC ) 10 MG tablet Take 1 tablet (10 mg total) by mouth daily.   aspirin 81 MG tablet Take 81 mg by mouth daily.   Cholecalciferol (THERA-D 2000) 50 MCG (2000 UT) TABS 1 tab by mouth once daily   meloxicam (MOBIC) 15 MG tablet Take 15 mg by mouth daily.   rosuvastatin  (CRESTOR ) 20 MG tablet Take 1 tablet (20 mg total) by mouth daily.   scopolamine  (TRANSDERM-SCOP) 1 MG/3DAYS Place 1 patch (1.5 mg total) onto the skin every 3 (three) days.   telmisartan  (MICARDIS ) 80 MG tablet Take 1 tablet (  80 mg total) by mouth daily.   No facility-administered encounter  medications on file as of 07/29/2024.    History: Past Medical History:  Diagnosis Date   ERECTILE DYSFUNCTION 04/08/2007   Full dentures    GERD (gastroesophageal reflux disease)    HEAD TRAUMA, CLOSED 04/08/2007   HYPERCHOLESTEROLEMIA 04/08/2007   HYPERLIPIDEMIA 04/11/2007   HYPERTENSION 04/08/2007   Impaired glucose tolerance 07/05/2013   Morbid obesity (HCC) 04/08/2007   Preventative health care 03/22/2011   Wears glasses    Past Surgical History:  Procedure Laterality Date   CIRCUMCISION     HIP SURGERY Left 12/25/2023   INSERTION OF MESH N/A 10/19/2013   Procedure: INSERTION OF MESH;  Surgeon: Elon CHRISTELLA Pacini, MD;  Location: [facility];  Service: General;  Laterality: N/A;   MULTIPLE TOOTH EXTRACTIONS     UMBILICAL HERNIA REPAIR N/A 10/19/2013   Procedure: UMBILICAL HERNIA REPAIR WITH MESH;  Surgeon: Elon CHRISTELLA Pacini, MD;  Location: [facility];  Service: General;  Laterality: N/A;   Family History  Problem Relation Age of Onset   Hypertension Other    Heart disease Other    Diabetes Other    Cancer Other        breast cancer   Colon cancer Neg Hx    Social History   Occupational History   Occupation: Engineer, [employer]   Occupation: RETIRED  Tobacco Use   Smoking status: Never   Smokeless tobacco: Never  Vaping Use   Vaping status: Never Used  Substance and Sexual Activity   Alcohol use: No   Drug use: No   Sexual activity: Not on file   Tobacco Counseling Counseling given: Not Answered  SDOH Screenings   Food Insecurity: No Food Insecurity (07/29/2024)  Housing: Unknown (07/29/2024)  Transportation Needs: No Transportation Needs (07/29/2024)  Utilities: Not At Risk (07/29/2024)  Alcohol Screen: Low Risk (07/24/2023)  Depression (PHQ2-9): Low Risk (07/29/2024)  Financial Resource Strain: Low Risk (07/24/2023)  Physical Activity: Sufficiently Active (07/29/2024)  Social Connections: Socially Integrated  (07/29/2024)  Stress: No Stress Concern Present (07/29/2024)  Tobacco Use: Low Risk (07/29/2024)  Health Literacy: Adequate Health Literacy (07/29/2024)   See flowsheets for full screening details  Depression Screen PHQ 2 & 9 Depression Scale- Over the past 2 weeks, how often have you been bothered by any of the following problems? Little interest or pleasure in doing things: 0 Feeling down, depressed, or hopeless (PHQ Adolescent also includes...irritable): 0 PHQ-2 Total Score: 0 Trouble falling or staying asleep, or sleeping too much: 0 Feeling tired or having little energy: 0 Poor appetite or overeating (PHQ Adolescent also includes...weight loss): 0 Feeling bad about yourself - or that you are a failure or have let yourself or your family down: 0 Trouble concentrating on things, such as reading the newspaper or watching television (PHQ Adolescent also includes...like school work): 0 Moving or speaking so slowly that other people could have noticed. Or the opposite - being so fidgety or restless that you have been moving around a lot more than usual: 0 Thoughts that you would be better off dead, or of hurting yourself in some way: 0 PHQ-9 Total Score: 0 If you checked off any problems, how difficult have these problems made it for you to do your work, take care of things at home, or get along with other people?: Not difficult at all  Depression Treatment Depression Interventions/Treatment : EYV7-0 Score <4 Follow-up Not Indicated  Goals Addressed   None          Objective:    Today's Vitals   07/29/24 1143  Weight: 258 lb (117 kg)  Height: 6' 3 (1.905 m)   Body mass index is 32.25 kg/m.  Hearing/Vision screen Hearing Screening - Comments:: Denies hearing difficulties   Vision Screening - Comments:: Wears eyeglasses/[facility]-4 Seasons/UTD Immunizations and Health Maintenance Health Maintenance  Topic Date Due   Zoster Vaccines- Shingrix (1 of 2) Never done    Influenza Vaccine  03/11/2024   Colonoscopy  11/20/2024   Medicare Annual Wellness (AWV)  07/29/2025   DTaP/Tdap/Td (3 - Td or Tdap) 03/27/2030   Pneumococcal Vaccine: 50+ Years  Completed   Hepatitis C Screening  Completed   Meningococcal B Vaccine  Aged Out   COVID-19 Vaccine  Discontinued        Assessment/Plan:  This is a routine wellness examination for Timoteo.  Patient Care Team: Norleen Lynwood ORN, MD as PCP - General  I have personally reviewed and noted the following in the patients chart:   Medical and social history Use of alcohol, tobacco or illicit drugs  Current medications and supplements including opioid prescriptions. Functional ability and status Nutritional status Physical activity Advanced directives List of other physicians Hospitalizations, surgeries, and ER visits in previous 12 months Vitals Screenings to include cognitive, depression, and falls Referrals and appointments  No orders of the defined types were placed in this encounter.  In addition, I have reviewed and discussed with patient certain preventive protocols, quality metrics, and best practice recommendations. A written personalized care plan for preventive services as well as general preventive health recommendations were provided to patient.   Damaria Vachon L Jayleigh Notarianni, CMA   07/29/2024   Return in 1 year (on 07/29/2025).  After Visit Summary: (MyChart) Due to this being a telephonic visit, the after visit summary with patients personalized plan was offered to patient via MyChart   Nurse Notes: Patient is due for Shingles and flu vaccine.  He had no other concerns to address today.   "
